# Patient Record
Sex: Female | Born: 1980 | Race: Black or African American | Hispanic: No | Marital: Married | Smoking: Never smoker
Health system: Southern US, Community
[De-identification: ages and names within clinical notes are randomized; demographics above are authoritative.]

## PROBLEM LIST (undated history)

## (undated) DIAGNOSIS — D649 Anemia, unspecified: Secondary | ICD-10-CM

## (undated) DIAGNOSIS — D219 Benign neoplasm of connective and other soft tissue, unspecified: Secondary | ICD-10-CM

## (undated) DIAGNOSIS — N83209 Unspecified ovarian cyst, unspecified side: Secondary | ICD-10-CM

## (undated) DIAGNOSIS — I10 Essential (primary) hypertension: Secondary | ICD-10-CM

## (undated) DIAGNOSIS — R51 Headache: Secondary | ICD-10-CM

## (undated) DIAGNOSIS — R519 Headache, unspecified: Secondary | ICD-10-CM

## (undated) HISTORY — PX: OVARIAN CYST REMOVAL: SHX89

## (undated) HISTORY — PX: TUBAL LIGATION: SHX77

## (undated) HISTORY — DX: Anemia, unspecified: D64.9

---

## 2014-05-11 ENCOUNTER — Emergency Department (HOSPITAL_COMMUNITY)
Admission: EM | Admit: 2014-05-11 | Discharge: 2014-05-11 | Disposition: A | Discharge status: Home / Self Care | Payer: Self-pay | Attending: Emergency Medicine | Admitting: Emergency Medicine

## 2014-05-11 ENCOUNTER — Encounter (HOSPITAL_COMMUNITY): Payer: Self-pay | Admitting: Emergency Medicine

## 2014-05-11 DIAGNOSIS — I10 Essential (primary) hypertension: Secondary | ICD-10-CM | POA: Insufficient documentation

## 2014-05-11 DIAGNOSIS — N760 Acute vaginitis: Secondary | ICD-10-CM | POA: Insufficient documentation

## 2014-05-11 DIAGNOSIS — Z79899 Other long term (current) drug therapy: Secondary | ICD-10-CM | POA: Insufficient documentation

## 2014-05-11 DIAGNOSIS — Z3202 Encounter for pregnancy test, result negative: Secondary | ICD-10-CM | POA: Insufficient documentation

## 2014-05-11 DIAGNOSIS — D649 Anemia, unspecified: Secondary | ICD-10-CM | POA: Insufficient documentation

## 2014-05-11 HISTORY — DX: Essential (primary) hypertension: I10

## 2014-05-11 LAB — CBC WITH DIFFERENTIAL/PLATELET
BASOS ABS: 0 10*3/uL (ref 0.0–0.1)
Basophils Relative: 0 % (ref 0–1)
EOS ABS: 0.1 10*3/uL (ref 0.0–0.7)
Eosinophils Relative: 1 % (ref 0–5)
HCT: 32 % — ABNORMAL LOW (ref 36.0–46.0)
Hemoglobin: 9.7 g/dL — ABNORMAL LOW (ref 12.0–15.0)
LYMPHS ABS: 2.4 10*3/uL (ref 0.7–4.0)
Lymphocytes Relative: 37 % (ref 12–46)
MCH: 16.9 pg — AB (ref 26.0–34.0)
MCHC: 30.3 g/dL (ref 30.0–36.0)
MCV: 55.8 fL — AB (ref 78.0–100.0)
MONO ABS: 0.4 10*3/uL (ref 0.1–1.0)
Monocytes Relative: 6 % (ref 3–12)
Neutro Abs: 3.7 10*3/uL (ref 1.7–7.7)
Neutrophils Relative %: 56 % (ref 43–77)
PLATELETS: 200 10*3/uL (ref 150–400)
RBC: 5.73 MIL/uL — AB (ref 3.87–5.11)
RDW: 19.6 % — AB (ref 11.5–15.5)
WBC: 6.6 10*3/uL (ref 4.0–10.5)

## 2014-05-11 LAB — URINALYSIS, ROUTINE W REFLEX MICROSCOPIC
BILIRUBIN URINE: NEGATIVE
BILIRUBIN URINE: NEGATIVE
GLUCOSE, UA: NEGATIVE mg/dL
Glucose, UA: NEGATIVE mg/dL
Hgb urine dipstick: NEGATIVE
Hgb urine dipstick: NEGATIVE
Ketones, ur: NEGATIVE mg/dL
Ketones, ur: NEGATIVE mg/dL
NITRITE: NEGATIVE
Nitrite: NEGATIVE
PROTEIN: NEGATIVE mg/dL
Protein, ur: NEGATIVE mg/dL
SPECIFIC GRAVITY, URINE: 1.008 (ref 1.005–1.030)
Specific Gravity, Urine: 1.028 (ref 1.005–1.030)
UROBILINOGEN UA: 1 mg/dL (ref 0.0–1.0)
UROBILINOGEN UA: 0.2 mg/dL (ref 0.0–1.0)
pH: 5.5 (ref 5.0–8.0)
pH: 5.5 (ref 5.0–8.0)

## 2014-05-11 LAB — BASIC METABOLIC PANEL
ANION GAP: 13 (ref 5–15)
BUN: 12 mg/dL (ref 6–23)
CALCIUM: 8.6 mg/dL (ref 8.4–10.5)
CO2: 21 mEq/L (ref 19–32)
CREATININE: 0.78 mg/dL (ref 0.50–1.10)
Chloride: 106 mEq/L (ref 96–112)
Glucose, Bld: 86 mg/dL (ref 70–99)
Potassium: 3.7 mEq/L (ref 3.7–5.3)
SODIUM: 140 meq/L (ref 137–147)

## 2014-05-11 LAB — URINE MICROSCOPIC-ADD ON

## 2014-05-11 LAB — WET PREP, GENITAL
Trich, Wet Prep: NONE SEEN
YEAST WET PREP: NONE SEEN

## 2014-05-11 LAB — POC URINE PREG, ED: Preg Test, Ur: NEGATIVE

## 2014-05-11 MED ORDER — ONDANSETRON HCL 4 MG/2ML IJ SOLN
4.0000 mg | Freq: Once | INTRAMUSCULAR | Status: AC
  Administered 2014-05-11: 4 mg via INTRAVENOUS
  Filled 2014-05-11: qty 2

## 2014-05-11 MED ORDER — LIDOCAINE HCL 1 % IJ SOLN
INTRAMUSCULAR | Status: AC
  Administered 2014-05-11: 22:00:00
  Filled 2014-05-11: qty 20

## 2014-05-11 MED ORDER — AZITHROMYCIN 250 MG PO TABS
1000.0000 mg | ORAL_TABLET | Freq: Once | ORAL | Status: AC
  Administered 2014-05-11: 1000 mg via ORAL
  Filled 2014-05-11: qty 4

## 2014-05-11 MED ORDER — MORPHINE SULFATE 4 MG/ML IJ SOLN
4.0000 mg | Freq: Once | INTRAMUSCULAR | Status: AC
  Administered 2014-05-11: 4 mg via INTRAVENOUS
  Filled 2014-05-11: qty 1

## 2014-05-11 MED ORDER — HYDROCODONE-ACETAMINOPHEN 5-325 MG PO TABS: 1.0000 | ORAL_TABLET | ORAL | Status: DC | PRN

## 2014-05-11 MED ORDER — CEFTRIAXONE SODIUM 250 MG IJ SOLR
250.0000 mg | Freq: Once | INTRAMUSCULAR | Status: AC
  Administered 2014-05-11: 250 mg via INTRAMUSCULAR
  Filled 2014-05-11: qty 250

## 2014-05-11 MED ORDER — ONDANSETRON HCL 4 MG PO TABS: 4.0000 mg | ORAL_TABLET | Freq: Four times a day (QID) | ORAL | Status: DC

## 2014-05-11 NOTE — Progress Notes (Signed)
  CARE MANAGEMENT ED NOTE 05/11/2014  Patient:  LIL, LEPAGE   Account Number:  [id number]  Date Initiated:  05/11/2014  Documentation initiated by:  Livia Snellen  Subjective/Objective Assessment:   Patient presents to Ed with left lower abdominal pain and left flank pain     Subjective/Objective Assessment Detail:     Action/Plan:   Action/Plan Detail:   Anticipated DC Date:       Status Recommendation to Physician:   Result of Recommendation:    Other ED [place]  Other  PCP issues    Choice offered to / List presented to:            Status of service:  Completed, signed off  ED Comments:   ED Comments Detail:  EDCM spoke ot patient at bedside.  Patient confirms she does not have a pcp or insurance living in [place]. EDCM provide patient with pamphlet to [facility], informed patient of services there and walk in times.  EDCM also provided patient with list of pcps who accept self pay patients, list of discount pharmacies and websites needymeds.org and GoodRX.com for medication assistance, phone number to inquire about the orange card, phone number to inquire about [employer], phone number to inquire about the [place], financial resources in the community such as local churches, salvation [employer], urban ministries, and dental assistance for uninsured patients. Patient thankfulf or resources.  No further EDCM needs at this time. Referral made to [facility] rep.

## 2014-05-11 NOTE — ED Notes (Signed)
Patient with c/o LLQ abdominal pain and left side flank pain Patient denies dysuria, N/V/D or other issues Patient ambulatory from triage without difficulty or assistance from staff Patient alert and oriented x 4 upon arrival

## 2014-05-11 NOTE — ED Provider Notes (Signed)
CSN: [id number]     Arrival date & time 05/11/14  1750 History   First MD Initiated Contact with Patient 05/11/14 Robin Larson     Chief Complaint  Patient presents with  . Flank Pain  . Abdominal Pain     (Consider location/radiation/quality/duration/timing/severity/associated sxs/prior Treatment) HPI  Patient presents to the ER for evaluation of left sided flank pain that started on Monday. She has also been having urinary frequency. She denies abnormal uterine bleeding, dysuria, vaginal discharge, N/V/D or fevers.She feels as though these are premenstrual cramping but she is not bleeding and continues to have pain. Her symptoms have actually resolved moderately but she asked her grandmother if she has had these pains before and she reportedly have uterine fibroids. Because of this, the patient felt like she needed to be seen emergenetly. She declines offer for IV. She declines offer for pain medications at this time.    Past Medical History  Diagnosis Date  . Hypertension    History reviewed. No pertinent past surgical history. History reviewed. No pertinent family history. History  Substance Use Topics  . Smoking status: Never Smoker   . Smokeless tobacco: Not on file  . Alcohol Use: No   OB History   Grav Para Term Preterm Abortions TAB SAB Ect Mult Living                 Review of Systems   Review of Systems  Gen: no weight loss, fevers, chills, night sweats  Eyes: no occular draining, occular pain,  No visual changes  Nose: no epistaxis or rhinorrhea  Mouth: no dental pain, no sore throat  Neck: no neck pain  Lungs: No hemoptysis. No wheezing or coughing CV:  No palpitations, dependent edema or orthopnea. No chest pain Abd: no diarrhea. No nausea or vomiting, No abdominal pain  GU: no dysuria or gross hematuria  + flank pain MSK:  No muscle weakness, No muscular pain Neuro: no headache, no focal neurologic deficits  Skin: no rash , no wounds Psyche: no complaints of  depression or anxiety    Allergies  Codeine  Home Medications   Prior to Admission medications   Medication Sig Start Date End Date Taking? Authorizing Provider  BIOTIN PO Take 1 tablet by mouth daily.   Yes Historical Provider, MD  ibuprofen (ADVIL,MOTRIN) 200 MG tablet Take 800 mg by mouth every 6 (six) hours as needed for moderate pain.   Yes Historical Provider, MD   BP 139/73  Pulse 77  Temp(Src) 98.7 F (37.1 C) (Oral)  Resp 18  SpO2 100%  LMP 04/11/2014 Physical Exam  Nursing note and vitals reviewed. Constitutional: She appears well-developed and well-nourished. No distress.  HENT:  Head: Normocephalic and atraumatic.  Eyes: Pupils are equal, round, and reactive to light.  Neck: Normal range of motion. Neck supple.  Cardiovascular: Normal rate and regular rhythm.   Pulmonary/Chest: Effort normal.  Abdominal: Soft. Bowel sounds are normal. She exhibits no distension and no fluid wave. There is tenderness. There is CVA tenderness (left side).  Genitourinary:  Left sided flank pain.  Neurological: She is alert.  Skin: Skin is warm and dry.    ED Course  Procedures (including critical care time) Labs Review Labs Reviewed  WET PREP, GENITAL - Abnormal; Notable for the following:    Clue Cells Wet Prep HPF POC FEW (*)    WBC, Wet Prep HPF POC MANY (*)    All other components within normal limits  URINALYSIS, ROUTINE W  REFLEX MICROSCOPIC - Abnormal; Notable for the following:    APPearance CLOUDY (*)    Leukocytes, UA LARGE (*)    All other components within normal limits  URINE MICROSCOPIC-ADD ON - Abnormal; Notable for the following:    Squamous Epithelial / LPF MANY (*)    Bacteria, UA MANY (*)    All other components within normal limits  URINALYSIS, ROUTINE W REFLEX MICROSCOPIC - Abnormal; Notable for the following:    Leukocytes, UA SMALL (*)    All other components within normal limits  CBC WITH DIFFERENTIAL - Abnormal; Notable for the following:     RBC 5.73 (*)    Hemoglobin 9.7 (*)    HCT 32.0 (*)    MCV 55.8 (*)    MCH 16.9 (*)    RDW 19.6 (*)    All other components within normal limits  GC/CHLAMYDIA PROBE AMP  BASIC METABOLIC PANEL  URINE MICROSCOPIC-ADD ON  POC URINE PREG, ED    Imaging Review No results found.   EKG Interpretation None      MDM   Final diagnoses:  Anemia, unspecified anemia type  Vaginosis    Medications  azithromycin (ZITHROMAX) tablet 1,000 mg (not administered)  cefTRIAXone (ROCEPHIN) injection 250 mg (not administered)  ondansetron (ZOFRAN) injection 4 mg (4 mg Intravenous Given 05/11/14 2027)  morphine 4 MG/ML injection 4 mg (4 mg Intravenous Given 05/11/14 2027)    Patient to the ER with flank/lower quadrant abdominal pains. She had a dirty urine but after a repeat (clean catch) Her urine looks much improved. Her CBC shows 9.7 hemoglobin she reports having been told she is anemic and not taking  Her iron as directed. She is unsure of her baseline.  Her BMP is unremarkable. Her wet prep shows MANY WBC. Treated with Azithromycin and Rocephin in the ED. Will dc with pain and nausea medications.  33 y.o.[name]'s evaluation in the Emergency Department is complete. It has been determined that no acute conditions requiring further emergency intervention are present at this time. The patient/guardian have been advised of the diagnosis and plan. We have discussed signs and symptoms that warrant return to the ED, such as changes or worsening in symptoms.  Vital signs are stable at discharge. Filed Vitals:   05/11/14 1756  BP: 139/73  Pulse: 77  Temp: 98.7 F (37.1 C)  Resp: 18    Patient/guardian has voiced understanding and agreed to follow-up with the PCP or specialist.    Linus Mako, PA-C 05/11/14 2152

## 2014-05-11 NOTE — ED Provider Notes (Signed)
Medical screening examination/treatment/procedure(s) were performed by non-physician practitioner and as supervising physician I was immediately available for consultation/collaboration.   EKG Interpretation None       Orlie Dakin, MD 05/11/14 2308

## 2014-05-11 NOTE — Discharge Instructions (Signed)

## 2014-05-11 NOTE — ED Notes (Signed)
Pt c/o LLQ pain radiating to flank since Monday.  Denies NVD.  Denies vaginal discharge.  Denies dysuria.

## 2014-05-12 LAB — GC/CHLAMYDIA PROBE AMP
CT Probe RNA: POSITIVE — AB
GC Probe RNA: NEGATIVE

## 2014-05-13 ENCOUNTER — Telehealth (HOSPITAL_BASED_OUTPATIENT_CLINIC_OR_DEPARTMENT_OTHER): Payer: Self-pay | Admitting: Emergency Medicine

## 2014-05-13 NOTE — Telephone Encounter (Signed)
Positive Chlamydia culture Treated with Zithromax and Rocephin [facility] faxed  Left message for patient to call flow managers #

## 2014-05-15 ENCOUNTER — Telehealth (HOSPITAL_COMMUNITY): Payer: Self-pay

## 2014-06-19 ENCOUNTER — Encounter (HOSPITAL_COMMUNITY): Payer: Self-pay | Admitting: Emergency Medicine

## 2014-06-19 ENCOUNTER — Emergency Department (HOSPITAL_COMMUNITY): Payer: Self-pay

## 2014-06-19 ENCOUNTER — Emergency Department (HOSPITAL_COMMUNITY)
Admission: EM | Admit: 2014-06-19 | Discharge: 2014-06-19 | Disposition: A | Discharge status: Home / Self Care | Payer: Self-pay | Attending: Emergency Medicine | Admitting: Emergency Medicine

## 2014-06-19 DIAGNOSIS — R229 Localized swelling, mass and lump, unspecified: Secondary | ICD-10-CM

## 2014-06-19 DIAGNOSIS — D500 Iron deficiency anemia secondary to blood loss (chronic): Secondary | ICD-10-CM

## 2014-06-19 DIAGNOSIS — I10 Essential (primary) hypertension: Secondary | ICD-10-CM | POA: Insufficient documentation

## 2014-06-19 DIAGNOSIS — Z8619 Personal history of other infectious and parasitic diseases: Secondary | ICD-10-CM | POA: Insufficient documentation

## 2014-06-19 DIAGNOSIS — R1032 Left lower quadrant pain: Secondary | ICD-10-CM | POA: Insufficient documentation

## 2014-06-19 DIAGNOSIS — IMO0002 Reserved for concepts with insufficient information to code with codable children: Secondary | ICD-10-CM

## 2014-06-19 DIAGNOSIS — R1904 Left lower quadrant abdominal swelling, mass and lump: Secondary | ICD-10-CM | POA: Insufficient documentation

## 2014-06-19 DIAGNOSIS — R52 Pain, unspecified: Secondary | ICD-10-CM

## 2014-06-19 DIAGNOSIS — Z79899 Other long term (current) drug therapy: Secondary | ICD-10-CM | POA: Insufficient documentation

## 2014-06-19 DIAGNOSIS — Z3202 Encounter for pregnancy test, result negative: Secondary | ICD-10-CM | POA: Insufficient documentation

## 2014-06-19 LAB — CBC WITH DIFFERENTIAL/PLATELET
Basophils Absolute: 0 10*3/uL (ref 0.0–0.1)
Basophils Relative: 0 % (ref 0–1)
EOS ABS: 0.1 10*3/uL (ref 0.0–0.7)
Eosinophils Relative: 2 % (ref 0–5)
HEMATOCRIT: 29.8 % — AB (ref 36.0–46.0)
HEMOGLOBIN: 8.9 g/dL — AB (ref 12.0–15.0)
LYMPHS PCT: 34 % (ref 12–46)
Lymphs Abs: 1.8 10*3/uL (ref 0.7–4.0)
MCH: 16.7 pg — ABNORMAL LOW (ref 26.0–34.0)
MCHC: 29.9 g/dL — ABNORMAL LOW (ref 30.0–36.0)
MCV: 56 fL — ABNORMAL LOW (ref 78.0–100.0)
MONOS PCT: 5 % (ref 3–12)
Monocytes Absolute: 0.3 10*3/uL (ref 0.1–1.0)
Neutro Abs: 3 10*3/uL (ref 1.7–7.7)
Neutrophils Relative %: 59 % (ref 43–77)
Platelets: 218 10*3/uL (ref 150–400)
RBC: 5.32 MIL/uL — AB (ref 3.87–5.11)
RDW: 20.3 % — ABNORMAL HIGH (ref 11.5–15.5)
WBC: 5.2 10*3/uL (ref 4.0–10.5)

## 2014-06-19 LAB — LIPASE, BLOOD: Lipase: 48 U/L (ref 11–59)

## 2014-06-19 LAB — URINALYSIS, ROUTINE W REFLEX MICROSCOPIC
BILIRUBIN URINE: NEGATIVE
GLUCOSE, UA: NEGATIVE mg/dL
Ketones, ur: NEGATIVE mg/dL
Nitrite: NEGATIVE
Protein, ur: NEGATIVE mg/dL
SPECIFIC GRAVITY, URINE: 1.018 (ref 1.005–1.030)
Urobilinogen, UA: 1 mg/dL (ref 0.0–1.0)
pH: 7 (ref 5.0–8.0)

## 2014-06-19 LAB — COMPREHENSIVE METABOLIC PANEL
ALBUMIN: 3.5 g/dL (ref 3.5–5.2)
ALK PHOS: 50 U/L (ref 39–117)
ALT: 9 U/L (ref 0–35)
AST: 12 U/L (ref 0–37)
Anion gap: 10 (ref 5–15)
BUN: 13 mg/dL (ref 6–23)
CHLORIDE: 106 meq/L (ref 96–112)
CO2: 23 mEq/L (ref 19–32)
Calcium: 8.9 mg/dL (ref 8.4–10.5)
Creatinine, Ser: 0.8 mg/dL (ref 0.50–1.10)
GFR calc Af Amer: >90 mL/min (ref >90)
GFR calc non Af Amer: >90 mL/min (ref >90)
Glucose, Bld: 95 mg/dL (ref 70–99)
Potassium: 4.1 mEq/L (ref 3.7–5.3)
SODIUM: 139 meq/L (ref 137–147)
TOTAL PROTEIN: 7 g/dL (ref 6.0–8.3)

## 2014-06-19 LAB — URINE MICROSCOPIC-ADD ON

## 2014-06-19 LAB — POC URINE PREG, ED: PREG TEST UR: NEGATIVE

## 2014-06-19 MED ORDER — IOHEXOL 300 MG/ML  SOLN
50.0000 mL | Freq: Once | INTRAMUSCULAR | Status: AC | PRN
  Administered 2014-06-19: 50 mL via ORAL

## 2014-06-19 MED ORDER — MORPHINE SULFATE 4 MG/ML IJ SOLN
4.0000 mg | Freq: Once | INTRAMUSCULAR | Status: AC
  Administered 2014-06-19: 4 mg via INTRAVENOUS
  Filled 2014-06-19: qty 1

## 2014-06-19 MED ORDER — SODIUM CHLORIDE 0.9 % IV BOLUS (SEPSIS)
1000.0000 mL | Freq: Once | INTRAVENOUS | Status: AC
  Administered 2014-06-19: 1000 mL via INTRAVENOUS

## 2014-06-19 MED ORDER — IOHEXOL 300 MG/ML  SOLN
100.0000 mL | Freq: Once | INTRAMUSCULAR | Status: AC | PRN
  Administered 2014-06-19: 100 mL via INTRAVENOUS

## 2014-06-19 MED ORDER — KETOROLAC TROMETHAMINE 30 MG/ML IJ SOLN
30.0000 mg | Freq: Once | INTRAMUSCULAR | Status: AC
  Administered 2014-06-19: 30 mg via INTRAVENOUS
  Filled 2014-06-19: qty 1

## 2014-06-19 MED ORDER — ONDANSETRON HCL 4 MG/2ML IJ SOLN
4.0000 mg | Freq: Once | INTRAMUSCULAR | Status: AC
  Administered 2014-06-19: 4 mg via INTRAVENOUS
  Filled 2014-06-19: qty 2

## 2014-06-19 MED ORDER — OXYCODONE-ACETAMINOPHEN 5-325 MG PO TABS
2.0000 | ORAL_TABLET | ORAL | Status: DC | PRN
Start: 2014-06-19 — End: 2014-08-01

## 2014-06-19 NOTE — ED Provider Notes (Addendum)
CSN: [id number]     Arrival date & time 06/19/14  1150 History   First MD Initiated Contact with Patient 06/19/14 1309     Chief Complaint  Patient presents with  . Abdominal Pain     (Consider location/radiation/quality/duration/timing/severity/associated sxs/prior Treatment) HPI 33 year old female who presents today complaining of left lower abdominal pain. She states it has been present for a month. She was seen here for the same pain in October. She says that it has been present ongoing some level since that time. She states it does wax and wane and feels has gotten worse. She has regular menstrual periods to her last menstrual cycle being last week. Says that they are heavy. She denies any urinary tract infection symptoms such as increased frequency of urination or pain with urination. She denies any change in bowel habits. She has been taking by mouth as usual and has not had any change in her weight. Her last visit she was positive for chlamydia and was treated here. Past Medical History  Diagnosis Date  . Hypertension    History reviewed. No pertinent past surgical history. No family history on file. History  Substance Use Topics  . Smoking status: Never Smoker   . Smokeless tobacco: Not on file  . Alcohol Use: No   OB History    No data available     Review of Systems  All other systems reviewed and are negative.     Allergies  Codeine  Home Medications   Prior to Admission medications   Medication Sig Start Date End Date Taking? Authorizing Provider  BIOTIN PO Take 1 tablet by mouth daily.   Yes Historical Provider, MD  HYDROcodone-acetaminophen (NORCO/VICODIN) 5-325 MG per tablet Take 1-2 tablets by mouth every 4 (four) hours as needed for moderate pain or severe pain. 05/11/14  Yes Tiffany Marilu Favre, PA-C  ibuprofen (ADVIL,MOTRIN) 200 MG tablet Take 800 mg by mouth every 6 (six) hours as needed for moderate pain.   Yes Historical Provider, MD  ondansetron  (ZOFRAN) 4 MG tablet Take 1 tablet (4 mg total) by mouth every 6 (six) hours. Patient not taking: Reported on 06/19/2014 05/11/14   Linus Mako, PA-C   BP 102/61 mmHg  Pulse 71  Temp(Src) 98.5 F (36.9 C) (Oral)  Resp 16  Wt 220 lb (99.791 kg)  SpO2 100%  LMP 06/12/2014 Physical Exam  Constitutional: She is oriented to person, place, and time. She appears well-developed and well-nourished.  HENT:  Head: Normocephalic and atraumatic.  Right Ear: External ear normal.  Left Ear: External ear normal.  Nose: Nose normal.  Mouth/Throat: Oropharynx is clear and moist.  Eyes: Conjunctivae and EOM are normal. Pupils are equal, round, and reactive to light.  Neck: Normal range of motion.  Cardiovascular: Normal rate and regular rhythm.   Pulmonary/Chest: Effort normal and breath sounds normal.  Abdominal: There is tenderness.    Genitourinary: Vagina normal. Uterus is enlarged and tender. Cervix exhibits no motion tenderness, no discharge and no friability. Left adnexum displays tenderness and fullness. Left adnexum displays no mass.  Musculoskeletal: Normal range of motion.  Neurological: She is alert and oriented to person, place, and time. She has normal reflexes.  Skin: Skin is warm and dry.  Psychiatric: She has a normal mood and affect. Her behavior is normal. Judgment and thought content normal.  Nursing note and vitals reviewed.   ED Course  Procedures (including critical care time) Labs Review Labs Reviewed  CBC WITH DIFFERENTIAL -  Abnormal; Notable for the following:    RBC 5.32 (*)    Hemoglobin 8.9 (*)    HCT 29.8 (*)    MCV 56.0 (*)    MCH 16.7 (*)    MCHC 29.9 (*)    RDW 20.3 (*)    All other components within normal limits  COMPREHENSIVE METABOLIC PANEL - Abnormal; Notable for the following:    Total Bilirubin <0.2 (*)    All other components within normal limits  URINALYSIS, ROUTINE W REFLEX MICROSCOPIC - Abnormal; Notable for the following:    APPearance  CLOUDY (*)    Hgb urine dipstick LARGE (*)    Leukocytes, UA SMALL (*)    All other components within normal limits  URINE MICROSCOPIC-ADD ON - Abnormal; Notable for the following:    Squamous Epithelial / LPF FEW (*)    Bacteria, UA FEW (*)    All other components within normal limits  LIPASE, BLOOD  POC URINE PREG, ED    Imaging Review Ct Abdomen Pelvis W Contrast  06/19/2014   CLINICAL DATA:  LEFT lower quadrant pain for 2 months. Constipation.  EXAM: CT ABDOMEN AND PELVIS WITH CONTRAST  TECHNIQUE: Multidetector CT imaging of the abdomen and pelvis was performed using the standard protocol following bolus administration of intravenous contrast.  CONTRAST:  124mL OMNIPAQUE IOHEXOL 300 MG/ML SOLN, 62mL OMNIPAQUE IOHEXOL 300 MG/ML SOLN  COMPARISON:  None.  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 No aggressive osseous lesions.  Lung Bases: Normal.  Liver:  Normal.  Spleen:  Normal.  Gallbladder:  Normal.  Common bile duct:  Normal.  Pancreas:  Normal.  Adrenal glands:  Normal bilaterally.  Kidneys: Normal enhancement. Both ureters appear within normal limits. No hydronephrosis. No calculi.  Stomach:  Partially collapsed.  No inflammatory changes.  Small bowel: Duodenum appears normal. No small bowel obstruction. No mesenteric adenopathy identified.  Colon: Large stool burden. Normal appendix. Rectosigmoid appears normal.  Pelvic Genitourinary: urinary bladder collapsed. Cystic LEFT adnexal mass is present. This measures 9.6 cm transverse by 5.7 cm AP and 11 cm craniocaudal. Multiple septations are present. Uterus grossly appears within normal limits. RIGHT adnexa abnormal.  Vasculature: Normal.  Body Wall: Tiny fat containing periumbilical hernia. Lower abdominal wall scar, probably from C-section.  IMPRESSION: Approximately 10 cm x 6 cm x 11 cm Cystic LEFT adnexal mass. Pelvic ultrasound recommended for further  assessment. This recommendation follows ACR consensus guidelines: White Paper of the ACR Incidental Findings Committee II on Adnexal Findings. J Am Coll Radiol [phone].   Electronically Signed   By: Dereck Ligas M.D.   On: 06/19/2014 15:36     EKG Interpretation None      MDM   Final diagnoses:  Pain  Mass    CT significant for llq mass, [place] ordered. Patient treated with rocephin and zithromax last evaluation.  No cultures sent today.   Discussed with Dr. Nehemiah Settle and they will call tomorrow for follow-up. He requests a CA-125 and this has been ordered. I have relayed all this to the patient she voices understanding of plan and need for close follow-up.  Shaune Pollack, MD 06/19/14 [place], MD 06/25/14 [phone]

## 2014-06-19 NOTE — ED Notes (Signed)
Notified Pt we needed urine sample. Pt stated she couldn't go at this time. Requested some water to help her go. Pt is also requesting Pain meds. Explained to Pt she had to wait till Dr has seen her. RN aware

## 2014-06-19 NOTE — Progress Notes (Signed)
  CARE MANAGEMENT ED NOTE 06/19/2014  Patient:  Robin Larson, Robin Larson   Account Number:  [id number]  Date Initiated:  06/19/2014  Documentation initiated by:  Livia Snellen  Subjective/Objective Assessment:   Patient presents to ED with left lower abdominal pain.     Subjective/Objective Assessment Detail:   Patient with pmhx of HTN     Action/Plan:   Action/Plan Detail:   Anticipated DC Date:       Status Recommendation to Physician:   Result of Recommendation:    Other ED [place]  Other  PCP issues    Choice offered to / List presented to:            Status of service:  Completed, signed off  ED Comments:   ED Comments Detail:  EDCM spoke to patient at bedside.  EDCM has spoken to this patient before in October.  Patient confirms she still has the information [facility] provided her at that time.  Patient reports she will be receiving health insurnace through her husband's job in January.  EDCM strongly encouraged patient to use resources provided to find a pcp.  Discussed purpose of pcp.  No further EDCM needs at this time.

## 2014-06-19 NOTE — ED Notes (Signed)
Pt alert, arrives from home, c/o left lower abd pain, seen recently with similar c/o, associates pain after having monthly cycle, resp even unlabored, skin pwd

## 2014-06-19 NOTE — Discharge Instructions (Signed)
You have a mass in the left pelvis.  This needs close follow up.  Gynecology should contact you tomorrow to arrange follow up.  If you are not contacted by Wednesday, you can call me for assistance. [phone])

## 2014-06-21 ENCOUNTER — Encounter: Payer: Self-pay | Admitting: Family Medicine

## 2014-06-21 ENCOUNTER — Ambulatory Visit (INDEPENDENT_AMBULATORY_CARE_PROVIDER_SITE_OTHER): Payer: Self-pay | Admitting: Family Medicine

## 2014-06-21 VITALS — BP 119/91 | HR 83 | Temp 98.5°F | Wt 247.5 lb

## 2014-06-21 DIAGNOSIS — N83299 Other ovarian cyst, unspecified side: Secondary | ICD-10-CM

## 2014-06-21 DIAGNOSIS — Z01419 Encounter for gynecological examination (general) (routine) without abnormal findings: Secondary | ICD-10-CM

## 2014-06-21 DIAGNOSIS — Z124 Encounter for screening for malignant neoplasm of cervix: Secondary | ICD-10-CM

## 2014-06-21 DIAGNOSIS — N8320 Unspecified ovarian cysts: Secondary | ICD-10-CM

## 2014-06-21 NOTE — Progress Notes (Signed)
Subjective:    Patient ID: Robin Larson, female    DOB: Jan 07, 1981, 33 y.o.   MRN: [id number]  HPI 33 yo G2P1103 referred by [facility] for a symptomatic left 10 cm ovarian cyst.  The patient has had left abdominal pain for 2-3 months that is intermittent in nature, but worsening.  Pain worse with movement.  She was evaluated previously for this in the ED and was treated for PID without improvement.  OTC analgesics were not helpful.  Since being in the ED, the pain has decreased and she has not used any of the Percocet prescribed to her.  GYN: Started menses at age 13, currently has 28 day cycle with moderate to heavy bleeding for 7-9 days.  OB History    Gravida Para Term Preterm AB TAB SAB Ectopic Multiple Living   2 2 1 1     1 3      Past Medical History  Diagnosis Date  . Hypertension   . Anemia    Current outpatient prescriptions: oxyCODONE-acetaminophen (PERCOCET/ROXICET) 5-325 MG per tablet, Take 2 tablets by mouth every 4 (four) hours as needed for severe pain., Disp: 15 tablet, Rfl: 0;  BIOTIN PO, Take 1 tablet by mouth daily., Disp: , Rfl:  HYDROcodone-acetaminophen (NORCO/VICODIN) 5-325 MG per tablet, Take 1-2 tablets by mouth every 4 (four) hours as needed for moderate pain or severe pain. (Patient not taking: Reported on 06/21/2014), Disp: 12 tablet, Rfl: 0;  ibuprofen (ADVIL,MOTRIN) 200 MG tablet, Take 800 mg by mouth every 6 (six) hours as needed for moderate pain., Disp: , Rfl:  ondansetron (ZOFRAN) 4 MG tablet, Take 1 tablet (4 mg total) by mouth every 6 (six) hours. (Patient not taking: Reported on 06/19/2014), Disp: 12 tablet, Rfl: 0   Allergies  Allergen Reactions  . Codeine     itching   History reviewed. No pertinent family history.  History   Social History  . Marital Status: Single    Spouse Name: N/A    Number of Children: N/A  . Years of Education: N/A   Occupational History  . Not on file.   Social History Main Topics  . Smoking status: Never  Smoker   . Smokeless tobacco: Never Used  . Alcohol Use: No  . Drug Use: No  . Sexual Activity: Yes    Birth Control/ Protection: None   Other Topics Concern  . Not on file   Social History Narrative     Review of Systems  Constitutional: Negative for fever, chills and fatigue.  Respiratory: Negative for cough, wheezing and stridor.   Cardiovascular: Negative for chest pain, palpitations and leg swelling.  Gastrointestinal: Negative for nausea, vomiting, abdominal pain, diarrhea and constipation.  Genitourinary: Negative for dysuria, urgency, hematuria, decreased urine volume, vaginal bleeding and vaginal pain.  Neurological: Negative for dizziness, tremors, seizures, weakness, numbness and headaches.       Objective:   Physical Exam  Constitutional: She is oriented to person, place, and time. She appears well-developed and well-nourished.  HENT:  Head: Normocephalic and atraumatic.  Eyes: Pupils are equal, round, and reactive to light.  Cardiovascular: Normal rate, regular rhythm and normal heart sounds.   Pulmonary/Chest: Effort normal and breath sounds normal. No respiratory distress. She has no wheezes. She has no rales. She exhibits no tenderness.  Abdominal: Soft. Bowel sounds are normal. She exhibits no distension. There is tenderness (left lower quadrant). Hernia confirmed negative in the right inguinal area and confirmed negative in the left inguinal  area.  Genitourinary: There is no rash, tenderness, lesion or injury on the right labia. There is no rash, tenderness, lesion or injury on the left labia. Uterus is not deviated, not enlarged, not fixed and not tender. Cervix exhibits no motion tenderness, no discharge and no friability. Right adnexum displays no mass, no tenderness and no fullness. Left adnexum displays no mass, no tenderness and no fullness. No erythema, tenderness or bleeding in the vagina. No foreign body around the vagina. No signs of injury around the  vagina. No vaginal discharge found.  Lymphadenopathy:       Right: No inguinal adenopathy present.       Left: No inguinal adenopathy present.  Neurological: She is alert and oriented to person, place, and time.  Skin: Skin is warm and dry.  Psychiatric: She has a normal mood and affect. Her behavior is normal. Judgment and thought content normal.  Vitals reviewed.     Assessment & Plan:   Problem List Items Addressed This Visit    Complex ovarian cyst - Primary    Other Visit Diagnoses    Pap smear, as part of routine gynecological examination          CA125 still pending. Discussed that if benign, may need to have cystectomy.  Schedule in 2 weeks with Gyn surgeon. PAP done.

## 2014-06-26 LAB — CYTOLOGY - PAP

## 2014-06-27 ENCOUNTER — Telehealth: Payer: Self-pay

## 2014-06-27 LAB — CA 125: CA 125: 12 U/mL (ref <35)

## 2014-06-27 NOTE — Telephone Encounter (Signed)
-----   Message from Truett Mainland, DO sent at 06/27/2014  3:05 PM EST ----- Has abnormal PAP smear - ASCUS, cannot rule out high grade. Has appt with Dr Elly Modena for ovarian cyst.  Please let pt know and add this to appt.

## 2014-06-27 NOTE — Telephone Encounter (Signed)
Need for colpo added to appt notes for 07/26/14. Called patient and informed her of results and need for colpo at next appointment. Patient verbalized understanding and reports having abnormal paps in the past. 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 Patient verbalized understanding. No further questions or concerns.

## 2014-07-26 ENCOUNTER — Ambulatory Visit (INDEPENDENT_AMBULATORY_CARE_PROVIDER_SITE_OTHER): Payer: Self-pay | Admitting: Obstetrics & Gynecology

## 2014-07-26 ENCOUNTER — Encounter: Payer: Self-pay | Admitting: Obstetrics & Gynecology

## 2014-07-26 VITALS — BP 122/82 | HR 86 | Temp 98.7°F | Ht 64.0 in | Wt 253.9 lb

## 2014-07-26 DIAGNOSIS — N83202 Unspecified ovarian cyst, left side: Secondary | ICD-10-CM

## 2014-07-26 DIAGNOSIS — N8320 Unspecified ovarian cysts: Secondary | ICD-10-CM

## 2014-07-26 NOTE — Patient Instructions (Signed)
Ovarian Cyst An ovarian cyst is a fluid-filled sac that forms on an ovary. The ovaries are small organs that produce eggs in women. Various types of cysts can form on the ovaries. Most are not cancerous. Many do not cause problems, and they often go away on their own. Some may cause symptoms and require treatment. Common types of ovarian cysts include:  Functional cysts--These cysts may occur every month during the menstrual cycle. This is normal. The cysts usually go away with the next menstrual cycle if the woman does not get pregnant. Usually, there are no symptoms with a functional cyst.  Endometrioma cysts--These cysts form from the tissue that lines the uterus. They are also called "chocolate cysts" because they become filled with blood that turns brown. This type of cyst can cause pain in the lower abdomen during intercourse and with your menstrual period.  Cystadenoma cysts--This type develops from the cells on the outside of the ovary. These cysts can get very big and cause lower abdomen pain and pain with intercourse. This type of cyst can twist on itself, cut off its blood supply, and cause severe pain. It can also easily rupture and cause a lot of pain.  Dermoid cysts--This type of cyst is sometimes found in both ovaries. These cysts may contain different kinds of body tissue, such as skin, teeth, hair, or cartilage. They usually do not cause symptoms unless they get very big.  Theca lutein cysts--These cysts occur when too much of a certain hormone (human chorionic gonadotropin) is produced and overstimulates the ovaries to produce an egg. This is most common after procedures used to assist with the conception of a baby (in vitro fertilization). CAUSES   Fertility drugs can cause a condition in which multiple large cysts are formed on the ovaries. This is called ovarian hyperstimulation syndrome.  A condition called polycystic ovary syndrome can cause hormonal imbalances that can lead to  nonfunctional ovarian cysts. SIGNS AND SYMPTOMS  Many ovarian cysts do not cause symptoms. If symptoms are present, they may include:  Pelvic pain or pressure.  Pain in the lower abdomen.  Pain during sexual intercourse.  Increasing girth (swelling) of the abdomen.  Abnormal menstrual periods.  Increasing pain with menstrual periods.  Stopping having menstrual periods without being pregnant. DIAGNOSIS  These cysts are commonly found during a routine or annual pelvic exam. Tests may be ordered to find out more about the cyst. These tests may include:  Ultrasound.  X-ray of the pelvis.  CT scan.  MRI.  Blood tests. TREATMENT  Many ovarian cysts go away on their own without treatment. Your health care provider may want to check your cyst regularly for 2-3 months to see if it changes. For women in menopause, it is particularly important to monitor a cyst closely because of the higher rate of ovarian cancer in menopausal women. When treatment is needed, it may include any of the following:  A procedure to drain the cyst (aspiration). This may be done using a long needle and ultrasound. It can also be done through a laparoscopic procedure. This involves using a thin, lighted tube with a tiny camera on the end (laparoscope) inserted through a small incision.  Surgery to remove the whole cyst. This may be done using laparoscopic surgery or an open surgery involving a larger incision in the lower abdomen.  Hormone treatment or birth control pills. These methods are sometimes used to help dissolve a cyst. HOME CARE INSTRUCTIONS   Only take over-the-counter   or prescription medicines as directed by your health care provider.  Follow up with your health care provider as directed.  Get regular pelvic exams and Pap tests. SEEK MEDICAL CARE IF:   Your periods are late, irregular, or painful, or they stop.  Your pelvic pain or abdominal pain does not go away.  Your abdomen becomes  larger or swollen.  You have pressure on your bladder or trouble emptying your bladder completely.  You have pain during sexual intercourse.  You have feelings of fullness, pressure, or discomfort in your stomach.  You lose weight for no apparent reason.  You feel generally ill.  You become constipated.  You lose your appetite.  You develop acne.  You have an increase in body and facial hair.  You are gaining weight, without changing your exercise and eating habits.  You think you are pregnant. SEEK IMMEDIATE MEDICAL CARE IF:   You have increasing abdominal pain.  You feel sick to your stomach (nauseous), and you throw up (vomit).  You develop a fever that comes on suddenly.  You have abdominal pain during a bowel movement.  Your menstrual periods become heavier than usual. MAKE SURE YOU:  Understand these instructions.  Will watch your condition.  Will get help right away if you are not doing well or get worse. Document Released: 07/14/2005 Document Revised: 07/19/2013 Document Reviewed: 03/21/2013 ExitCare Patient Information 2015 [employer]. This information is not intended to replace advice given to you by your health care provider. Make sure you discuss any questions you have with your health care provider.  

## 2014-07-26 NOTE — Progress Notes (Signed)
Abnormal pap will refer to [facility]. Message sent to [facility], LPN.

## 2014-07-26 NOTE — Progress Notes (Signed)
Subjective:     Patient ID: Robin Larson, female   DOB: 22-Dec-1980, 33 y.o.   MRN: [id number]  HPI Pt was seen in Nov in the ED and in the ofc and was dx'd with a left ov cyst.  She reports that she is no longer having pain but, is worried about the reoccurrence of the pain.   Past Medical History  Diagnosis Date  . Hypertension   . Anemia    Past Surgical History  Procedure Laterality Date  . Cesarean section     Current Outpatient Prescriptions on File Prior to Visit  Medication Sig Dispense Refill  . oxyCODONE-acetaminophen (PERCOCET/ROXICET) 5-325 MG per tablet Take 2 tablets by mouth every 4 (four) hours as needed for severe pain. 15 tablet 0  . BIOTIN PO Take 1 tablet by mouth daily.    Marland Kitchen HYDROcodone-acetaminophen (NORCO/VICODIN) 5-325 MG per tablet Take 1-2 tablets by mouth every 4 (four) hours as needed for moderate pain or severe pain. (Patient not taking: Reported on 06/21/2014) 12 tablet 0  . ibuprofen (ADVIL,MOTRIN) 200 MG tablet Take 800 mg by mouth every 6 (six) hours as needed for moderate pain.    Marland Kitchen ondansetron (ZOFRAN) 4 MG tablet Take 1 tablet (4 mg total) by mouth every 6 (six) hours. (Patient not taking: Reported on 06/19/2014) 12 tablet 0   No current facility-administered medications on file prior to visit.   Allergies  Allergen Reactions  . Codeine     itching   History   Social History  . Marital Status: Single    Spouse Name: N/A    Number of Children: N/A  . Years of Education: N/A   Occupational History  . Not on file.   Social History Main Topics  . Smoking status: Never Smoker   . Smokeless tobacco: Never Used  . Alcohol Use: No  . Drug Use: No  . Sexual Activity: Yes    Birth Control/ Protection: None   Other Topics Concern  . Not on file   Social History Narrative     Review of Systems     Objective:   Physical Exam BP 122/82 mmHg  Pulse 86  Temp(Src) 98.7 F (37.1 C) (Oral)  Ht 5\' 4"  (1.626 m)  Wt 253 lb 14.4 oz (115.168  kg)  BMI 43.56 kg/m2  LMP 07/10/2014 (Exact Date) Pt in NAD Abd: well healed transverse incision.  NT, ND GU: EGBUS: no lesions Vagina: no blood in vault Cervix: no lesion; no mucopurulent d/c Uterus: small, mobile Adnexa: large mass on left side.  Appears to be above the uterus.    06/19/2014 EXAM: TRANSABDOMINAL AND TRANSVAGINAL ULTRASOUND OF PELVIS  TECHNIQUE: Both transabdominal and transvaginal ultrasound examinations of the pelvis were performed. Transabdominal technique was performed for global imaging of the pelvis including uterus, ovaries, adnexal regions, and pelvic cul-de-sac. It was necessary to proceed with endovaginal exam following the transabdominal exam to visualize the endometrium.  COMPARISON: CT abdomen and pelvis 06/19/2014  FINDINGS: Uterus  Measurements: 8.8 x 5.6 x 5.7 cm. Heterogeneous myometrial echogenicity. Hypoechoic nodule anterior mid uterus question leiomyoma 1.8 x 1.7 x 1.1 cm.  Endometrium  Thickness: 12 mm thick. Diffuse mild heterogeneity without endometrial fluid or focal abnormality.  Right ovary  Measurements: 3.2 x 2.4 x 2.9 cm. Normal morphology without mass. Internal blood flow present on color Doppler imaging.  Left ovary  Normal appearing LEFT ovarian tissue not identified. Large complex cystic lesion in LEFT adnexa, seen only transabdominally due to high  position, measuring 11.0 x 6.0 x 9.3 cm in size, containing thick irregular septations and areas of mild peripheral/mural nodularity. Blood flow present within some of the thick septations.  Other findings  Trace free pelvic fluid. No additional adnexal masses.  IMPRESSION: Small probable uterine leiomyoma.  Large complex cystic mass within LEFT adnexa likely of LEFT ovarian origin and measuring 11.0 x 6.0 x 9.3 cm in size, containing peripheral/mural nodularity and thick irregular septations, some which contain internal blood flow.  Cystic  ovarian neoplasm not excluded; recommend surgical assessment.  This recommendation follows the consensus statement: Management of Asymptomatic Ovarian and Other Adnexal Cysts Imaged at [place]: Society of Radiologists in [place]. Radiology 2010; [phone].  06/19/2014 EXAM: CT ABDOMEN AND PELVIS WITH CONTRAST  TECHNIQUE: Multidetector CT imaging of the abdomen and pelvis was performed using the standard protocol following bolus administration of intravenous contrast.  CONTRAST: 16mL OMNIPAQUE IOHEXOL 300 MG/ML SOLN, 19mL OMNIPAQUE IOHEXOL 300 MG/ML SOLN  COMPARISON: None.  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 No aggressive osseous lesions.  Lung Bases: Normal.  Liver: Normal.  Spleen: Normal.  Gallbladder: Normal.  Common bile duct: Normal.  Pancreas: Normal.  Adrenal glands: Normal bilaterally.  Kidneys: Normal enhancement. Both ureters appear within normal limits. No hydronephrosis. No calculi.  Stomach: Partially collapsed. No inflammatory changes.  Small bowel: Duodenum appears normal. No small bowel obstruction. No mesenteric adenopathy identified.  Colon: Large stool burden. Normal appendix. Rectosigmoid appears normal.  Pelvic Genitourinary: urinary bladder collapsed. Cystic LEFT adnexal mass is present. This measures 9.6 cm transverse by 5.7 cm AP and 11 cm craniocaudal. Multiple septations are present. Uterus grossly appears within normal limits. RIGHT adnexa abnormal.  Vasculature: Normal.  Body Wall: Tiny fat containing periumbilical hernia. Lower abdominal wall scar, probably from C-section.  IMPRESSION: Approximately 10 cm x 6 cm x 11 cm Cystic LEFT adnexal mass. Pelvic ultrasound recommended for further assessment. This recommendation follows ACR consensus guidelines: White Paper of the ACR  Incidental Findings Committee II on Adnexal Findings. J Am Coll Radiol [id number].     Assessment:     11 cm left ov cyst. Normal CA 125. Rec surgical resection.  Counseled pt about the risks of surgery. There is also a small probability of malignancy.     Plan:     Patient desires surgical management with left oophorectomy vs pvarian cystectomy.  The risks of surgery were discussed in detail with the patient including but not limited to: bleeding which may require transfusion or reoperation; infection which may require prolonged hospitalization or re-hospitalization and antibiotic therapy; injury to bowel, bladder, ureters and major vessels or other surrounding organs; need for additional procedures including laparotomy; thromboembolic phenomenon, incisional problems and other postoperative or anesthesia complications.  Patient was told that the likelihood that her condition and symptoms will be treated effectively with this surgical management was very high; the postoperative expectations were also discussed in detail. The patient also understands the alternative treatment options which were discussed in full. All questions were answered.  She was told that she will be contacted by our surgical scheduler regarding the time and date of her surgery; routine preoperative instructions of having nothing to eat or drink after midnight on the day prior to surgery and also coming to the hospital 1 1/2 hours prior to her time of surgery were also emphasized.  She was told she may be called for a preoperative appointment about  a week prior to surgery and will be given further preoperative instructions at that visit. Printed patient education handouts about the procedure were given to the patient to review at home.

## 2014-07-27 ENCOUNTER — Encounter (HOSPITAL_COMMUNITY): Payer: Self-pay

## 2014-07-27 ENCOUNTER — Encounter (HOSPITAL_COMMUNITY)
Admission: RE | Admit: 2014-07-27 | Discharge: 2014-07-27 | Disposition: A | Discharge status: Home / Self Care | Payer: Self-pay | Source: Ambulatory Visit | Attending: Obstetrics & Gynecology | Admitting: Obstetrics & Gynecology

## 2014-07-27 DIAGNOSIS — Z01812 Encounter for preprocedural laboratory examination: Secondary | ICD-10-CM | POA: Insufficient documentation

## 2014-07-27 HISTORY — DX: Headache: R51

## 2014-07-27 HISTORY — DX: Headache, unspecified: R51.9

## 2014-07-27 LAB — CBC
HEMATOCRIT: 30.3 % — AB (ref 36.0–46.0)
HEMOGLOBIN: 8.9 g/dL — AB (ref 12.0–15.0)
MCH: 16.3 pg — ABNORMAL LOW (ref 26.0–34.0)
MCHC: 29.4 g/dL — ABNORMAL LOW (ref 30.0–36.0)
MCV: 55.4 fL — ABNORMAL LOW (ref 78.0–100.0)
Platelets: 234 10*3/uL (ref 150–400)
RBC: 5.47 MIL/uL — AB (ref 3.87–5.11)
RDW: 20.3 % — ABNORMAL HIGH (ref 11.5–15.5)
WBC: 5 10*3/uL (ref 4.0–10.5)

## 2014-07-27 NOTE — Patient Instructions (Addendum)
   Your procedure is scheduled on:  Tuesday, Jan 5  Enter through the [employer] [facility] at: 2 PM Pick up the phone at the desk and dial [phone] and inform us of your arrival.  Please call this number if you have any problems the morning of surgery: [phone]  Remember: Do not eat food after midnight: Monday Do not drink clear liquids after: 10 AM Tuesday, day of surgery Take these medicines the morning of surgery with a SIP OF WATER:  None  Do not wear jewelry, make-up, or FINGER nail polish No metal in your hair or on your body. Do not wear lotions, powders, perfumes.  You may wear deodorant.  Do not bring valuables to the hospital. Contacts, dentures or bridgework may not be worn into surgery.  Leave suitcase in the car. After Surgery it may be brought to your room. For patients being admitted to the hospital, checkout time is 11:00am the day of discharge.  Home with husband "Okelly" cell [phone]  Patients discharged on the day of surgery will not be allowed to drive home.

## 2014-07-31 MED ORDER — DEXTROSE 5 % IV SOLN
2.0000 g | INTRAVENOUS | Status: AC
  Administered 2014-08-01: 2 g via INTRAVENOUS
  Filled 2014-07-31: qty 2

## 2014-07-31 NOTE — OR Nursing (Signed)
Called and spoke with pt. Instructed her that her surgery had been moved up and she should be here at 1215.

## 2014-08-01 ENCOUNTER — Ambulatory Visit (HOSPITAL_COMMUNITY): Payer: 59 | Admitting: Certified Registered Nurse Anesthetist

## 2014-08-01 ENCOUNTER — Ambulatory Visit (HOSPITAL_COMMUNITY)
Admission: RE | Admit: 2014-08-01 | Discharge: 2014-08-01 | Disposition: A | Discharge status: Home / Self Care | Payer: 59 | Source: Ambulatory Visit | Attending: Obstetrics & Gynecology | Admitting: Obstetrics & Gynecology

## 2014-08-01 ENCOUNTER — Encounter (HOSPITAL_COMMUNITY)
Admission: RE | Disposition: A | Discharge status: Home / Self Care | Payer: Self-pay | Source: Ambulatory Visit | Attending: Obstetrics & Gynecology

## 2014-08-01 ENCOUNTER — Encounter (HOSPITAL_COMMUNITY): Payer: Self-pay

## 2014-08-01 DIAGNOSIS — Z6841 Body Mass Index (BMI) 40.0 and over, adult: Secondary | ICD-10-CM | POA: Diagnosis not present

## 2014-08-01 DIAGNOSIS — Z886 Allergy status to analgesic agent status: Secondary | ICD-10-CM | POA: Insufficient documentation

## 2014-08-01 DIAGNOSIS — Z7982 Long term (current) use of aspirin: Secondary | ICD-10-CM | POA: Diagnosis not present

## 2014-08-01 DIAGNOSIS — I10 Essential (primary) hypertension: Secondary | ICD-10-CM | POA: Insufficient documentation

## 2014-08-01 DIAGNOSIS — N8320 Unspecified ovarian cysts: Secondary | ICD-10-CM | POA: Diagnosis present

## 2014-08-01 DIAGNOSIS — N83202 Unspecified ovarian cyst, left side: Secondary | ICD-10-CM

## 2014-08-01 HISTORY — PX: LAPAROTOMY: SHX154

## 2014-08-01 HISTORY — PX: OOPHORECTOMY: SHX6387

## 2014-08-01 LAB — CBC
HCT: 32 % — ABNORMAL LOW (ref 36.0–46.0)
HEMOGLOBIN: 9.5 g/dL — AB (ref 12.0–15.0)
MCH: 16.3 pg — ABNORMAL LOW (ref 26.0–34.0)
MCHC: 29.7 g/dL — ABNORMAL LOW (ref 30.0–36.0)
MCV: 55 fL — AB (ref 78.0–100.0)
Platelets: 223 10*3/uL (ref 150–400)
RBC: 5.82 MIL/uL — AB (ref 3.87–5.11)
RDW: 20.4 % — ABNORMAL HIGH (ref 11.5–15.5)
WBC: 5.4 10*3/uL (ref 4.0–10.5)

## 2014-08-01 LAB — TYPE AND SCREEN
ABO/RH(D): A POS
Antibody Screen: NEGATIVE

## 2014-08-01 LAB — PREGNANCY, URINE: Preg Test, Ur: NEGATIVE

## 2014-08-01 LAB — ABO/RH: ABO/RH(D): A POS

## 2014-08-01 SURGERY — LAPAROTOMY, EXPLORATORY
Anesthesia: General | Site: Abdomen | Laterality: Left

## 2014-08-01 MED ORDER — OXYCODONE-ACETAMINOPHEN 10-325 MG PO TABS: 1.0000 | ORAL_TABLET | ORAL | Status: DC | PRN

## 2014-08-01 MED ORDER — PROPOFOL 10 MG/ML IV BOLUS
INTRAVENOUS | Status: DC | PRN
  Administered 2014-08-01: 200 mg via INTRAVENOUS
  Administered 2014-08-01: 50 mg via INTRAVENOUS

## 2014-08-01 MED ORDER — NEOSTIGMINE METHYLSULFATE 10 MG/10ML IV SOLN
INTRAVENOUS | Status: DC | PRN
  Administered 2014-08-01: 4 mg via INTRAVENOUS

## 2014-08-01 MED ORDER — ROCURONIUM BROMIDE 100 MG/10ML IV SOLN
INTRAVENOUS | Status: DC | PRN
  Administered 2014-08-01: 50 mg via INTRAVENOUS

## 2014-08-01 MED ORDER — FENTANYL CITRATE 0.05 MG/ML IJ SOLN
INTRAMUSCULAR | Status: AC
  Filled 2014-08-01: qty 5

## 2014-08-01 MED ORDER — FENTANYL CITRATE 0.05 MG/ML IJ SOLN
INTRAMUSCULAR | Status: AC
  Filled 2014-08-01: qty 2

## 2014-08-01 MED ORDER — FENTANYL CITRATE 0.05 MG/ML IJ SOLN
25.0000 ug | INTRAMUSCULAR | Status: DC | PRN
  Administered 2014-08-01 (×2): 50 ug via INTRAVENOUS

## 2014-08-01 MED ORDER — BUPIVACAINE HCL (PF) 0.5 % IJ SOLN
INTRAMUSCULAR | Status: AC
  Filled 2014-08-01: qty 30

## 2014-08-01 MED ORDER — SCOPOLAMINE 1 MG/3DAYS TD PT72
MEDICATED_PATCH | TRANSDERMAL | Status: AC
  Filled 2014-08-01: qty 1

## 2014-08-01 MED ORDER — SCOPOLAMINE 1 MG/3DAYS TD PT72
1.0000 | MEDICATED_PATCH | Freq: Once | TRANSDERMAL | Status: AC
  Administered 2014-08-01: 1.5 mg via TRANSDERMAL
  Administered 2014-08-01: 1 via TRANSDERMAL

## 2014-08-01 MED ORDER — ONDANSETRON HCL 4 MG/2ML IJ SOLN
INTRAMUSCULAR | Status: DC | PRN
  Administered 2014-08-01: 4 mg via INTRAVENOUS

## 2014-08-01 MED ORDER — MIDAZOLAM HCL 2 MG/2ML IJ SOLN
INTRAMUSCULAR | Status: DC | PRN
  Administered 2014-08-01: 2 mg via INTRAVENOUS

## 2014-08-01 MED ORDER — LIDOCAINE HCL (CARDIAC) 20 MG/ML IV SOLN
INTRAVENOUS | Status: AC
  Filled 2014-08-01: qty 5

## 2014-08-01 MED ORDER — PROPOFOL 10 MG/ML IV BOLUS
INTRAVENOUS | Status: AC
  Filled 2014-08-01: qty 20

## 2014-08-01 MED ORDER — LIDOCAINE HCL (CARDIAC) 20 MG/ML IV SOLN
INTRAVENOUS | Status: DC | PRN
  Administered 2014-08-01: 100 mg via INTRAVENOUS

## 2014-08-01 MED ORDER — KETOROLAC TROMETHAMINE 30 MG/ML IJ SOLN
INTRAMUSCULAR | Status: AC
  Filled 2014-08-01: qty 1

## 2014-08-01 MED ORDER — DEXAMETHASONE SODIUM PHOSPHATE 10 MG/ML IJ SOLN
INTRAMUSCULAR | Status: AC
  Filled 2014-08-01: qty 1

## 2014-08-01 MED ORDER — 0.9 % SODIUM CHLORIDE (POUR BTL) OPTIME
TOPICAL | Status: DC | PRN
  Administered 2014-08-01: 1000 mL

## 2014-08-01 MED ORDER — MIDAZOLAM HCL 2 MG/2ML IJ SOLN
INTRAMUSCULAR | Status: AC
  Filled 2014-08-01: qty 2

## 2014-08-01 MED ORDER — BUPIVACAINE LIPOSOME 1.3 % IJ SUSP
20.0000 mL | Freq: Once | INTRAMUSCULAR | Status: DC
  Filled 2014-08-01: qty 20

## 2014-08-01 MED ORDER — LACTATED RINGERS IV SOLN: INTRAVENOUS | Status: DC

## 2014-08-01 MED ORDER — KETOROLAC TROMETHAMINE 30 MG/ML IJ SOLN
INTRAMUSCULAR | Status: DC | PRN
  Administered 2014-08-01: 30 mg via INTRAVENOUS

## 2014-08-01 MED ORDER — MEPERIDINE HCL 25 MG/ML IJ SOLN: 6.2500 mg | INTRAMUSCULAR | Status: DC | PRN

## 2014-08-01 MED ORDER — IBUPROFEN 600 MG PO TABS: 600.0000 mg | ORAL_TABLET | Freq: Four times a day (QID) | ORAL | Status: DC | PRN

## 2014-08-01 MED ORDER — NEOSTIGMINE METHYLSULFATE 10 MG/10ML IV SOLN
INTRAVENOUS | Status: AC
  Filled 2014-08-01: qty 1

## 2014-08-01 MED ORDER — GLYCOPYRROLATE 0.2 MG/ML IJ SOLN
INTRAMUSCULAR | Status: AC
  Filled 2014-08-01: qty 3

## 2014-08-01 MED ORDER — ONDANSETRON HCL 4 MG/2ML IJ SOLN
INTRAMUSCULAR | Status: AC
  Filled 2014-08-01: qty 2

## 2014-08-01 MED ORDER — GLYCOPYRROLATE 0.2 MG/ML IJ SOLN
INTRAMUSCULAR | Status: DC | PRN
  Administered 2014-08-01: 0.4 mg via INTRAVENOUS
  Administered 2014-08-01: 0.2 mg via INTRAVENOUS

## 2014-08-01 MED ORDER — DEXAMETHASONE SODIUM PHOSPHATE 10 MG/ML IJ SOLN
INTRAMUSCULAR | Status: DC | PRN
  Administered 2014-08-01: 10 mg via INTRAVENOUS

## 2014-08-01 MED ORDER — FENTANYL CITRATE 0.05 MG/ML IJ SOLN
INTRAMUSCULAR | Status: DC | PRN
  Administered 2014-08-01: 50 ug via INTRAVENOUS
  Administered 2014-08-01 (×2): 100 ug via INTRAVENOUS
  Administered 2014-08-01: 50 ug via INTRAVENOUS

## 2014-08-01 MED ORDER — BUPIVACAINE HCL (PF) 0.5 % IJ SOLN
INTRAMUSCULAR | Status: DC | PRN
  Administered 2014-08-01: 30 mL

## 2014-08-01 MED ORDER — ROCURONIUM BROMIDE 100 MG/10ML IV SOLN
INTRAVENOUS | Status: AC
  Filled 2014-08-01: qty 1

## 2014-08-01 MED ORDER — LACTATED RINGERS IV SOLN
INTRAVENOUS | Status: DC
  Administered 2014-08-01: 14:00:00 via INTRAVENOUS
  Administered 2014-08-01: 125 mL/h via INTRAVENOUS

## 2014-08-01 MED ORDER — METOCLOPRAMIDE HCL 5 MG/ML IJ SOLN: 10.0000 mg | Freq: Once | INTRAMUSCULAR | Status: DC | PRN

## 2014-08-01 SURGICAL SUPPLY — 45 items
BARRIER ADHS 3X4 INTERCEED (GAUZE/BANDAGES/DRESSINGS) IMPLANT
BENZOIN TINCTURE PRP APPL 2/3 (GAUZE/BANDAGES/DRESSINGS) ×6 IMPLANT
BLADE SURG 10 STRL SS (BLADE) ×6 IMPLANT
CANISTER SUCT 3000ML (MISCELLANEOUS) ×3 IMPLANT
CELLS DAT CNTRL 66122 CELL SVR (MISCELLANEOUS) IMPLANT
CHLORAPREP W/TINT 26ML (MISCELLANEOUS) IMPLANT
CLOSURE WOUND 1/2 X4 (GAUZE/BANDAGES/DRESSINGS) ×1
CLOTH BEACON ORANGE TIMEOUT ST (SAFETY) ×3 IMPLANT
CONT PATH 16OZ SNAP LID 3702 (MISCELLANEOUS) ×3 IMPLANT
DECANTER SPIKE VIAL GLASS SM (MISCELLANEOUS) IMPLANT
DRAPE WARM FLUID 44X44 (DRAPE) ×3 IMPLANT
DRSG OPSITE POSTOP 4X10 (GAUZE/BANDAGES/DRESSINGS) ×3 IMPLANT
DRSG TELFA 3X8 NADH (GAUZE/BANDAGES/DRESSINGS) ×3 IMPLANT
GAUZE SPONGE 4X4 12PLY STRL (GAUZE/BANDAGES/DRESSINGS) ×3 IMPLANT
GAUZE SPONGE 4X4 16PLY XRAY LF (GAUZE/BANDAGES/DRESSINGS) ×3 IMPLANT
GLOVE BIO SURGEON STRL SZ7 (GLOVE) ×3 IMPLANT
GLOVE BIOGEL PI IND STRL 7.0 (GLOVE) ×3 IMPLANT
GLOVE BIOGEL PI INDICATOR 7.0 (GLOVE) ×6
GOWN STRL REUS W/TWL LRG LVL3 (GOWN DISPOSABLE) ×12 IMPLANT
NEEDLE HYPO 25X1 1.5 SAFETY (NEEDLE) ×3 IMPLANT
NS IRRIG 1000ML POUR BTL (IV SOLUTION) ×6 IMPLANT
PACK ABDOMINAL GYN (CUSTOM PROCEDURE TRAY) ×3 IMPLANT
PAD ABD 7.5X8 STRL (GAUZE/BANDAGES/DRESSINGS) ×6 IMPLANT
PAD OB MATERNITY 4.3X12.25 (PERSONAL CARE ITEMS) ×3 IMPLANT
PROTECTOR NERVE ULNAR (MISCELLANEOUS) ×3 IMPLANT
RETRACTOR WND ALEXIS 25 LRG (MISCELLANEOUS) IMPLANT
RTRCTR WOUND ALEXIS 18CM MED (MISCELLANEOUS)
RTRCTR WOUND ALEXIS 25CM LRG (MISCELLANEOUS)
SPONGE LAP 18X18 X RAY DECT (DISPOSABLE) ×6 IMPLANT
STAPLER VISISTAT 35W (STAPLE) ×3 IMPLANT
STRIP CLOSURE SKIN 1/2X4 (GAUZE/BANDAGES/DRESSINGS) ×2 IMPLANT
SUT VIC AB 0 CT1 18XCR BRD8 (SUTURE) ×1 IMPLANT
SUT VIC AB 0 CT1 27 (SUTURE) ×4
SUT VIC AB 0 CT1 27XBRD ANBCTR (SUTURE) ×2 IMPLANT
SUT VIC AB 0 CT1 8-18 (SUTURE) ×2
SUT VIC AB 3-0 CT1 27 (SUTURE) ×2
SUT VIC AB 3-0 CT1 TAPERPNT 27 (SUTURE) ×1 IMPLANT
SUT VIC AB 3-0 SH 27 (SUTURE)
SUT VIC AB 3-0 SH 27X BRD (SUTURE) IMPLANT
SUT VIC AB 4-0 KS 27 (SUTURE) ×3 IMPLANT
SUT VICRYL 0 TIES 12 18 (SUTURE) IMPLANT
SYR CONTROL 10ML LL (SYRINGE) ×6 IMPLANT
TOWEL OR 17X24 6PK STRL BLUE (TOWEL DISPOSABLE) ×6 IMPLANT
TRAY FOLEY CATH 14FR (SET/KITS/TRAYS/PACK) ×3 IMPLANT
WATER STERILE IRR 1000ML POUR (IV SOLUTION) IMPLANT

## 2014-08-01 NOTE — Progress Notes (Signed)
Patient ID: Robin Larson, female   DOB: Nov 12, 1980, 34 y.o.   MRN: [id number] HPI Pt was seen in Nov in the ED and in the ofc and was dx'd with a left ov cyst. She reports that she is no longer having pain but, is worried about the reoccurrence of the pain.   Past Medical History  Diagnosis Date  . Hypertension   . Anemia    Past Surgical History  Procedure Laterality Date  . Cesarean section     Current Outpatient Prescriptions on File Prior to Visit  Medication Sig Dispense Refill  . oxyCODONE-acetaminophen (PERCOCET/ROXICET) 5-325 MG per tablet Take 2 tablets by mouth every 4 (four) hours as needed for severe pain. 15 tablet 0  . BIOTIN PO Take 1 tablet by mouth daily.    Marland Kitchen HYDROcodone-acetaminophen (NORCO/VICODIN) 5-325 MG per tablet Take 1-2 tablets by mouth every 4 (four) hours as needed for moderate pain or severe pain. (Patient not taking: Reported on 06/21/2014) 12 tablet 0  . ibuprofen (ADVIL,MOTRIN) 200 MG tablet Take 800 mg by mouth every 6 (six) hours as needed for moderate pain.    Marland Kitchen ondansetron (ZOFRAN) 4 MG tablet Take 1 tablet (4 mg total) by mouth every 6 (six) hours. (Patient not taking: Reported on 06/19/2014) 12 tablet 0   No current facility-administered medications on file prior to visit.   Allergies  Allergen Reactions  . Codeine     itching   History   Social History  . Marital Status: Single    Spouse Name: N/A    Number of Children: N/A  . Years of Education: N/A   Occupational History  . Not on file.   Social History Main Topics  . Smoking status: Never Smoker   . Smokeless tobacco: Never Used  . Alcohol Use: No  . Drug Use: No  . Sexual Activity: Yes    Birth Control/ Protection: None   Other Topics Concern  . Not on file   Social History Narrative     Review of Systems     Objective:   Physical Exam. BP 125/89 mmHg   Pulse 88  Temp(Src) 98.8 F (37.1 C) (Oral)  Resp 16  SpO2 100%  LMP 07/10/2014 (Exact Date) Exam from 1 week ago Pt in NAD Abd: well healed transverse incision. NT, ND GU: EGBUS: no lesions Vagina: no blood in vault Cervix: no lesion; no mucopurulent d/c Uterus: small, mobile Adnexa: large mass on left side. Appears to be above the uterus.    06/19/2014 EXAM: TRANSABDOMINAL AND TRANSVAGINAL ULTRASOUND OF PELVIS  TECHNIQUE: Both transabdominal and transvaginal ultrasound examinations of the pelvis were performed. Transabdominal technique was performed for global imaging of the pelvis including uterus, ovaries, adnexal regions, and pelvic cul-de-sac. It was necessary to proceed with endovaginal exam following the transabdominal exam to visualize the endometrium.  COMPARISON: CT abdomen and pelvis 06/19/2014  FINDINGS: Uterus  Measurements: 8.8 x 5.6 x 5.7 cm. Heterogeneous myometrial echogenicity. Hypoechoic nodule anterior mid uterus question leiomyoma 1.8 x 1.7 x 1.1 cm.  Endometrium  Thickness: 12 mm thick. Diffuse mild heterogeneity without endometrial fluid or focal abnormality.  Right ovary  Measurements: 3.2 x 2.4 x 2.9 cm. Normal morphology without mass. Internal blood flow present on color Doppler imaging.  Left ovary  Normal appearing LEFT ovarian tissue not identified. Large complex cystic lesion in LEFT adnexa, seen only transabdominally due to high position, measuring 11.0 x 6.0 x 9.3 cm in size, containing thick irregular septations and areas  of mild peripheral/mural nodularity. Blood flow present within some of the thick septations.  Other findings  Trace free pelvic fluid. No additional adnexal masses.  IMPRESSION: Small probable uterine leiomyoma.  Large complex cystic mass within LEFT adnexa likely of LEFT ovarian origin and measuring 11.0 x 6.0 x 9.3 cm in size, containing peripheral/mural nodularity and thick  irregular septations, some which contain internal blood flow.  Cystic ovarian neoplasm not excluded; recommend surgical assessment.  This recommendation follows the consensus statement: Management of Asymptomatic Ovarian and Other Adnexal Cysts Imaged at [place]: Society of Radiologists in [place]. Radiology 2010; [phone].  06/19/2014 EXAM: CT ABDOMEN AND PELVIS WITH CONTRAST  TECHNIQUE: Multidetector CT imaging of the abdomen and pelvis was performed using the standard protocol following bolus administration of intravenous contrast.  CONTRAST: 117mL OMNIPAQUE IOHEXOL 300 MG/ML SOLN, 72mL OMNIPAQUE IOHEXOL 300 MG/ML SOLN  COMPARISON: None.  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 No aggressive osseous lesions.  Lung Bases: Normal.  Liver: Normal.  Spleen: Normal.  Gallbladder: Normal.  Common bile duct: Normal.  Pancreas: Normal.  Adrenal glands: Normal bilaterally.  Kidneys: Normal enhancement. Both ureters appear within normal limits. No hydronephrosis. No calculi.  Stomach: Partially collapsed. No inflammatory changes.  Small bowel: Duodenum appears normal. No small bowel obstruction. No mesenteric adenopathy identified.  Colon: Large stool burden. Normal appendix. Rectosigmoid appears normal.  Pelvic Genitourinary: urinary bladder collapsed. Cystic LEFT adnexal mass is present. This measures 9.6 cm transverse by 5.7 cm AP and 11 cm craniocaudal. Multiple septations are present. Uterus grossly appears within normal limits. RIGHT adnexa abnormal.  Vasculature: Normal.  Body Wall: Tiny fat containing periumbilical hernia. Lower abdominal wall scar, probably from C-section.  IMPRESSION: Approximately 10 cm x 6 cm x 11 cm Cystic LEFT adnexal mass. Pelvic ultrasound recommended for further assessment. This  recommendation follows ACR consensus guidelines: White Paper of the ACR Incidental Findings Committee II on Adnexal Findings. J Am Coll Radiol [id number].     Assessment:     11 cm left ov cyst. Normal CA 125. Rec surgical resection. Counseled pt about the risks of surgery. There is also a small probability of malignancy.     Plan:     Patient desires surgical management with left oophorectomy vs pvarian cystectomy. The risks of surgery were discussed in detail with the patient including but not limited to: bleeding which may require transfusion or reoperation; infection which may require prolonged hospitalization or re-hospitalization and antibiotic therapy; injury to bowel, bladder, ureters and major vessels or other surrounding organs; need for additional procedures including laparotomy; thromboembolic phenomenon, incisional problems and other postoperative or anesthesia complications. Patient was told that the likelihood that her condition and symptoms will be treated effectively with this surgical management was very high; the postoperative expectations were also discussed in detail. The patient also understands the alternative treatment options which were discussed in full. All questions were answered.

## 2014-08-01 NOTE — Brief Op Note (Signed)
08/01/2014  2:48 PM  PATIENT:  Robin Larson  34 y.o. female  PRE-OPERATIVE DIAGNOSIS:  Left adnexal mass  POST-OPERATIVE DIAGNOSIS:  Left adnexal mass  PROCEDURE:  Procedure(s): LAPAROTOMY/REMOVAL OF LEFT OVARIAN CYSTECTOMY (Left) LEFT OOPHORECTOMY (Left)and left salpingectomy  SURGEON:  Surgeon(s) and Role:    * Lavonia Drafts, MD - Primary    * Mora Bellman, MD - Assisting  ANESTHESIA:   general  EBL:  Total I/O In: 1400 [I.V.:1400] Out: 260 [Urine:250; Blood:10]  BLOOD ADMINISTERED:none  DRAINS: none   LOCAL MEDICATIONS USED:  MARCAINE    and OTHER Exparel  SPECIMEN:  Source of Specimen:  left ovary with cyst  DISPOSITION OF SPECIMEN:  PATHOLOGY  COUNTS:  YES  TOURNIQUET:  * No tourniquets in log *  DICTATION: .Note written in EPIC  PLAN OF CARE: Discharge to home after PACU  PATIENT DISPOSITION:  PACU - hemodynamically stable.   Delay start of Pharmacological VTE agent (>24hrs) due to surgical blood loss or risk of bleeding: not applicable  Complications: none immediate

## 2014-08-01 NOTE — Op Note (Signed)
08/01/2014  2:48 PM  PATIENT:  Robin Larson  34 y.o. female  PRE-OPERATIVE DIAGNOSIS:  Left adnexal mass  POST-OPERATIVE DIAGNOSIS:  Left adnexal mass  PROCEDURE:  Procedure(s): LAPAROTOMY/REMOVAL OF LEFT OVARIAN CYSTECTOMY (Left) LEFT OOPHORECTOMY (Left)and left salpingectomy  SURGEON:  Surgeon(s) and Role:    * Lavonia Drafts, MD - Primary    * Mora Bellman, MD - Assisting  ANESTHESIA:   general  EBL:  Total I/O In: 1400 [I.V.:1400] Out: 260 [Urine:250; Blood:10]  BLOOD ADMINISTERED:none  DRAINS: none   LOCAL MEDICATIONS USED:  MARCAINE    and OTHER Exparel  SPECIMEN:  Source of Specimen:  left ovary with cyst  DISPOSITION OF SPECIMEN:  PATHOLOGY  COUNTS:  YES  TOURNIQUET:  * No tourniquets in log *  DICTATION: .Note written in EPIC  PLAN OF CARE: Discharge to home after PACU  PATIENT DISPOSITION:  PACU - hemodynamically stable.   Delay start of Pharmacological VTE agent (>24hrs) due to surgical blood loss or risk of bleeding: not applicable  Complications: none immediate   INDICATIONS: The patient is a 34 y.o. [id number] with  A left adnexal mass 11x6x9cm on CT scan with normal CA125 who desired definitive surgical management. On the preoperative visit, the risks, benefits, indications, and alternatives of the procedure were reviewed with the patient.  On the day of surgery, the risks of surgery were again discussed with the patient including but not limited to: bleeding which may require transfusion or reoperation; infection which may require antibiotics; injury to bowel, bladder, ureters or other surrounding organs; need for additional procedures; thromboembolic phenomenon, incisional problems and other postoperative/anesthesia complications. Written informed consent was obtained.    OPERATIVE FINDINGS: A 8 week size uterus with normal right fallopian tube and ovary.   The above reference mass was noted on the left adnexa.  There was no adhesion to the mass.   There were no other masses noted.  The liver capsule was smooth and there was no significant pelvic fluid.     DESCRIPTION OF PROCEDURE:  The patient received intravenous antibiotics and had sequential compression devices applied to her lower extremities while in the preoperative area.   She was taken to the operating room and placed under general anesthesia without difficulty.The abdomen and perineum were prepped and draped in a sterile manner, and she was placed in a dorsal supine position.  A Foley catheter was inserted into the bladder and attached to constant drainage. After an adequate timeout was performed, a Pfannensteil skin incision was made after 30cc of 0.5% Marcaine was injected. This incision was taken down to the fascia using electrocautery with care given to maintain good hemostasis. The fascia was incised in the midline and the fascial incision was then extended bilaterally using electrocautery without difficulty. The fascia was then dissected off the underlying rectus muscles using blunt and sharp dissection. The rectus muscles were split bluntly in the midline and the peritoneum entered sharply without complication. This peritoneal incision was then extended superiorly and inferiorly with care given to prevent bowel or bladder injury.  Attention was then turned to the pelvis.  An exploration of the abdomen and pelvis revealed the above findings.  The  ovary was easily identified at this point and brought through the incision. The left  infundibulopelvic ligament clamped on the patient's right side. This pedicle was then clamped, cut, and doubly suture ligated with 0 Vicryl. An attempt was initially made to save the fallopian tube but, there was bleeding in the mesosalpinx.  The pelvis was irrigated and hemostasis was reconfirmed at all pedicles.  The peritoneum was reapproximated with 0 vicryl running stitch, and the fascia was also closed in a running fashion with 0 vicryl suture. The  subcutaneous layer was irrigated and injected with 20 ml of Exparel. The skin was closed with a 4-0 Vicryl subcuticular stitch. Sponge, lap, needle, and instrument counts were correct times two. The patient was taken to the recovery area awake, extubated and in stable condition.  No retractors were needed for the case and at patients request she will be discharged post op on Percocet and Motrin.    Evangaline Jou L. Ihor Dow, MD, [place] Attending [place], [facility]

## 2014-08-01 NOTE — Anesthesia Preprocedure Evaluation (Signed)
Anesthesia Evaluation  Patient identified by MRN, date of birth, ID band Patient awake    Reviewed: Allergy & Precautions, NPO status , Patient's Chart, lab work & pertinent test results  Airway Mallampati: II  TM Distance: >3 FB Neck ROM: Full    Dental no notable dental hx. (+) Teeth Intact   Pulmonary neg pulmonary ROS,  breath sounds clear to auscultation  Pulmonary exam normal       Cardiovascular hypertension, Rhythm:Regular Rate:Normal     Neuro/Psych  Headaches, negative psych ROS   GI/Hepatic negative GI ROS, Neg liver ROS,   Endo/Other  Morbid obesity  Renal/GU negative Renal ROS  negative genitourinary   Musculoskeletal negative musculoskeletal ROS (+)   Abdominal (+) + obese,   Peds  Hematology  (+) anemia ,   Anesthesia Other Findings   Reproductive/Obstetrics Left Adnexal mass                             Anesthesia Physical Anesthesia Plan  ASA: III  Anesthesia Plan: General   Post-op Pain Management:    Induction: Intravenous  Airway Management Planned: Oral ETT  Additional Equipment:   Intra-op Plan:   Post-operative Plan: Extubation in OR  Informed Consent: I have reviewed the patients History and Physical, chart, labs and discussed the procedure including the risks, benefits and alternatives for the proposed anesthesia with the patient or authorized representative who has indicated his/her understanding and acceptance.   Dental advisory given  Plan Discussed with: Anesthesiologist, CRNA and Surgeon  Anesthesia Plan Comments:         Anesthesia Quick Evaluation

## 2014-08-01 NOTE — H&P (Signed)
Patient ID: Robin Larson, female DOB: 14-May-1981, 34 y.o. MRN: [id number] HPI Pt was seen in Nov in the ED and in the ofc and was dx'd with a left ov cyst. She reports that she is no longer having pain but, is worried about the reoccurrence of the pain.   Past Medical History  Diagnosis Date  . Hypertension   . Anemia    Past Surgical History  Procedure Laterality Date  . Cesarean section     Current Outpatient Prescriptions on File Prior to Visit  Medication Sig Dispense Refill  . oxyCODONE-acetaminophen (PERCOCET/ROXICET) 5-325 MG per tablet Take 2 tablets by mouth every 4 (four) hours as needed for severe pain. 15 tablet 0  . BIOTIN PO Take 1 tablet by mouth daily.    Marland Kitchen HYDROcodone-acetaminophen (NORCO/VICODIN) 5-325 MG per tablet Take 1-2 tablets by mouth every 4 (four) hours as needed for moderate pain or severe pain. (Patient not taking: Reported on 06/21/2014) 12 tablet 0  . ibuprofen (ADVIL,MOTRIN) 200 MG tablet Take 800 mg by mouth every 6 (six) hours as needed for moderate pain.    Marland Kitchen ondansetron (ZOFRAN) 4 MG tablet Take 1 tablet (4 mg total) by mouth every 6 (six) hours. (Patient not taking: Reported on 06/19/2014) 12 tablet 0   No current facility-administered medications on file prior to visit.   Allergies  Allergen Reactions  . Codeine     itching   History   Social History  . Marital Status: Single    Spouse Name: N/A    Number of Children: N/A  . Years of Education: N/A   Occupational History  . Not on file.   Social History Main Topics  . Smoking status: Never Smoker   . Smokeless tobacco: Never Used  . Alcohol Use: No  . Drug Use: No  . Sexual Activity: Yes    Birth Control/ Protection: None   Other Topics Concern  . Not on file    Social History Narrative     Review of Systems    Objective:  Physical Exam. BP 125/89 mmHg  Pulse 88  Temp(Src) 98.8 F (37.1 C) (Oral)  Resp 16  SpO2 100%  LMP 07/10/2014 (Exact Date) Exam from 1 week ago Pt in NAD Abd: well healed transverse incision. NT, ND GU: EGBUS: no lesions Vagina: no blood in vault Cervix: no lesion; no mucopurulent d/c Uterus: small, mobile Adnexa: large mass on left side. Appears to be above the uterus.    06/19/2014 EXAM: TRANSABDOMINAL AND TRANSVAGINAL ULTRASOUND OF PELVIS  TECHNIQUE: Both transabdominal and transvaginal ultrasound examinations of the pelvis were performed. Transabdominal technique was performed for global imaging of the pelvis including uterus, ovaries, adnexal regions, and pelvic cul-de-sac. It was necessary to proceed with endovaginal exam following the transabdominal exam to visualize the endometrium.  COMPARISON: CT abdomen and pelvis 06/19/2014  FINDINGS: Uterus  Measurements: 8.8 x 5.6 x 5.7 cm. Heterogeneous myometrial echogenicity. Hypoechoic nodule anterior mid uterus question leiomyoma 1.8 x 1.7 x 1.1 cm.  Endometrium  Thickness: 12 mm thick. Diffuse mild heterogeneity without endometrial fluid or focal abnormality.  Right ovary  Measurements: 3.2 x 2.4 x 2.9 cm. Normal morphology without mass. Internal blood flow present on color Doppler imaging.  Left ovary  Normal appearing LEFT ovarian tissue not identified. Large complex cystic lesion in LEFT adnexa, seen only transabdominally due to high position, measuring 11.0 x 6.0 x 9.3 cm in size, containing thick irregular septations and areas of mild peripheral/mural nodularity. Blood flow  present within some of the thick septations.  Other findings  Trace free pelvic fluid. No additional adnexal masses.  IMPRESSION: Small probable uterine leiomyoma.  Large complex cystic mass within LEFT adnexa likely of LEFT  ovarian origin and measuring 11.0 x 6.0 x 9.3 cm in size, containing peripheral/mural nodularity and thick irregular septations, some which contain internal blood flow.  Cystic ovarian neoplasm not excluded; recommend surgical assessment.  This recommendation follows the consensus statement: Management of Asymptomatic Ovarian and Other Adnexal Cysts Imaged at [place]: Society of Radiologists in [place]. Radiology 2010; [phone].  06/19/2014 EXAM: CT ABDOMEN AND PELVIS WITH CONTRAST  TECHNIQUE: Multidetector CT imaging of the abdomen and pelvis was performed using the standard protocol following bolus administration of intravenous contrast.  CONTRAST: 125mL OMNIPAQUE IOHEXOL 300 MG/ML SOLN, 2mL OMNIPAQUE IOHEXOL 300 MG/ML SOLN  COMPARISON: None.  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 No aggressive osseous lesions.  Lung Bases: Normal.  Liver: Normal.  Spleen: Normal.  Gallbladder: Normal.  Common bile duct: Normal.  Pancreas: Normal.  Adrenal glands: Normal bilaterally.  Kidneys: Normal enhancement. Both ureters appear within normal limits. No hydronephrosis. No calculi.  Stomach: Partially collapsed. No inflammatory changes.  Small bowel: Duodenum appears normal. No small bowel obstruction. No mesenteric adenopathy identified.  Colon: Large stool burden. Normal appendix. Rectosigmoid appears normal.  Pelvic Genitourinary: urinary bladder collapsed. Cystic LEFT adnexal mass is present. This measures 9.6 cm transverse by 5.7 cm AP and 11 cm craniocaudal. Multiple septations are present. Uterus grossly appears within normal limits. RIGHT adnexa abnormal.  Vasculature: Normal.  Body Wall: Tiny fat containing periumbilical hernia. Lower abdominal wall scar, probably from C-section.  IMPRESSION: Approximately  10 cm x 6 cm x 11 cm Cystic LEFT adnexal mass. Pelvic ultrasound recommended for further assessment. This recommendation follows ACR consensus guidelines: White Paper of the ACR Incidental Findings Committee II on Adnexal Findings. J Am Coll Radiol [phone].    Assessment:    11 cm left ov cyst. Normal CA 125. Rec surgical resection. Counseled pt about the risks of surgery. There is also a small probability of malignancy.    Plan:    Patient desires surgical management with left oophorectomy vs pvarian cystectomy. The risks of surgery were discussed in detail with the patient including but not limited to: bleeding which may require transfusion or reoperation; infection which may require prolonged hospitalization or re-hospitalization and antibiotic therapy; injury to bowel, bladder, ureters and major vessels or other surrounding organs; need for additional procedures including laparotomy; thromboembolic phenomenon, incisional problems and other postoperative or anesthesia complications. Patient was told that the likelihood that her condition and symptoms will be treated effectively with this surgical management was very high; the postoperative expectations were also discussed in detail. The patient also understands the alternative treatment options which were discussed in full. All questions were answered.          Patient ID: Robin Larson, female DOB: 02/28/1981, 34 y.o. MRN: [id number] HPI Pt was seen in Nov in the ED and in the ofc and was dx'd with a left ov cyst. She reports that she is no longer having pain but, is worried about the reoccurrence of the pain.   Past Medical History  Diagnosis Date  . Hypertension   . Anemia    Past Surgical History  Procedure Laterality Date  . Cesarean section     Current Outpatient Prescriptions on File Prior to  Visit  Medication Sig Dispense Refill  .  oxyCODONE-acetaminophen (PERCOCET/ROXICET) 5-325 MG per tablet Take 2 tablets by mouth every 4 (four) hours as needed for severe pain. 15 tablet 0  . BIOTIN PO Take 1 tablet by mouth daily.    Marland Kitchen HYDROcodone-acetaminophen (NORCO/VICODIN) 5-325 MG per tablet Take 1-2 tablets by mouth every 4 (four) hours as needed for moderate pain or severe pain. (Patient not taking: Reported on 06/21/2014) 12 tablet 0  . ibuprofen (ADVIL,MOTRIN) 200 MG tablet Take 800 mg by mouth every 6 (six) hours as needed for moderate pain.    Marland Kitchen ondansetron (ZOFRAN) 4 MG tablet Take 1 tablet (4 mg total) by mouth every 6 (six) hours. (Patient not taking: Reported on 06/19/2014) 12 tablet 0   No current facility-administered medications on file prior to visit.   Allergies  Allergen Reactions  . Codeine     itching   History   Social History  . Marital Status: Single    Spouse Name: N/A    Number of Children: N/A  . Years of Education: N/A   Occupational History  . Not on file.   Social History Main Topics  . Smoking status: Never Smoker   . Smokeless tobacco: Never Used  . Alcohol Use: No  . Drug Use: No  . Sexual Activity: Yes    Birth Control/ Protection: None   Other Topics Concern  . Not on file   Social History Narrative     Review of Systems    Objective:  Physical Exam. BP 125/89 mmHg  Pulse 88  Temp(Src) 98.8 F (37.1 C) (Oral)  Resp 16  SpO2 100%  LMP 07/10/2014 (Exact Date) Exam from 1 week ago Pt in NAD Abd: well healed transverse incision. NT, ND GU: EGBUS: no lesions Vagina: no blood in vault Cervix: no lesion; no mucopurulent d/c Uterus: small, mobile Adnexa: large mass on left side. Appears to be above the uterus.    06/19/2014 EXAM: TRANSABDOMINAL AND TRANSVAGINAL ULTRASOUND OF  PELVIS  TECHNIQUE: Both transabdominal and transvaginal ultrasound examinations of the pelvis were performed. Transabdominal technique was performed for global imaging of the pelvis including uterus, ovaries, adnexal regions, and pelvic cul-de-sac. It was necessary to proceed with endovaginal exam following the transabdominal exam to visualize the endometrium.  COMPARISON: CT abdomen and pelvis 06/19/2014  FINDINGS: Uterus  Measurements: 8.8 x 5.6 x 5.7 cm. Heterogeneous myometrial echogenicity. Hypoechoic nodule anterior mid uterus question leiomyoma 1.8 x 1.7 x 1.1 cm.  Endometrium  Thickness: 12 mm thick. Diffuse mild heterogeneity without endometrial fluid or focal abnormality.  Right ovary  Measurements: 3.2 x 2.4 x 2.9 cm. Normal morphology without mass. Internal blood flow present on color Doppler imaging.  Left ovary  Normal appearing LEFT ovarian tissue not identified. Large complex cystic lesion in LEFT adnexa, seen only transabdominally due to high position, measuring 11.0 x 6.0 x 9.3 cm in size, containing thick irregular septations and areas of mild peripheral/mural nodularity. Blood flow present within some of the thick septations.  Other findings  Trace free pelvic fluid. No additional adnexal masses.  IMPRESSION: Small probable uterine leiomyoma.  Large complex cystic mass within LEFT adnexa likely of LEFT ovarian origin and measuring 11.0 x 6.0 x 9.3 cm in size, containing peripheral/mural nodularity and thick irregular septations, some which contain internal blood flow.  Cystic ovarian neoplasm not excluded; recommend surgical assessment.  This recommendation follows the consensus statement: Management of Asymptomatic Ovarian and Other Adnexal Cysts Imaged at [place]: Society  of Radiologists in [place]trasound Consensus Conference Statement. Radiology 2010; [phone].  06/19/2014 EXAM: CT ABDOMEN AND PELVIS WITH  CONTRAST  TECHNIQUE: Multidetector CT imaging of the abdomen and pelvis was performed using the standard protocol following bolus administration of intravenous contrast.  CONTRAST: 190mL OMNIPAQUE IOHEXOL 300 MG/ML SOLN, 28mL OMNIPAQUE IOHEXOL 300 MG/ML SOLN  COMPARISON: None.  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 No aggressive osseous lesions.  Lung Bases: Normal.  Liver: Normal.  Spleen: Normal.  Gallbladder: Normal.  Common bile duct: Normal.  Pancreas: Normal.  Adrenal glands: Normal bilaterally.  Kidneys: Normal enhancement. Both ureters appear within normal limits. No hydronephrosis. No calculi.  Stomach: Partially collapsed. No inflammatory changes.  Small bowel: Duodenum appears normal. No small bowel obstruction. No mesenteric adenopathy identified.  Colon: Large stool burden. Normal appendix. Rectosigmoid appears normal.  Pelvic Genitourinary: urinary bladder collapsed. Cystic LEFT adnexal mass is present. This measures 9.6 cm transverse by 5.7 cm AP and 11 cm craniocaudal. Multiple septations are present. Uterus grossly appears within normal limits. RIGHT adnexa abnormal.  Vasculature: Normal.  Body Wall: Tiny fat containing periumbilical hernia. Lower abdominal wall scar, probably from C-section.  IMPRESSION: Approximately 10 cm x 6 cm x 11 cm Cystic LEFT adnexal mass. Pelvic ultrasound recommended for further assessment. This recommendation follows ACR consensus guidelines: White Paper of the ACR Incidental Findings Committee II on Adnexal Findings. J Am Coll Radiol [phone].    Assessment:    11 cm left ov cyst. Normal CA 125. Rec surgical resection. Counseled pt about the risks of surgery. There is also a small probability of malignancy.    Plan:    Patient desires surgical management with left  oophorectomy vs pvarian cystectomy. The risks of surgery were discussed in detail with the patient including but not limited to: bleeding which may require transfusion or reoperation; infection which may require prolonged hospitalization or re-hospitalization and antibiotic therapy; injury to bowel, bladder, ureters and major vessels or other surrounding organs; need for additional procedures including laparotomy; thromboembolic phenomenon, incisional problems and other postoperative or anesthesia complications. Patient was told that the likelihood that her condition and symptoms will be treated effectively with this surgical management was very high; the postoperative expectations were also discussed in detail. The patient also understands the alternative treatment options which were discussed in full. All questions were answered.

## 2014-08-01 NOTE — Transfer of Care (Signed)
Immediate Anesthesia Transfer of Care Note  Patient: Robin Larson  Procedure(s) Performed: Procedure(s): LAPAROTOMY/REMOVAL OF LEFT OVARIAN CYSTECTOMY (Left) LEFT OOPHORECTOMY (Left)  Patient Location: PACU  Anesthesia Type:General  Level of Consciousness: awake, alert  and oriented  Airway & Oxygen Therapy: Patient Spontanous Breathing and Patient connected to nasal cannula oxygen  Post-op Assessment: Report given to PACU RN, Post -op Vital signs reviewed and stable and Patient moving all extremities X 4  Post vital signs: Reviewed and stable  Complications: No apparent anesthesia complications

## 2014-08-01 NOTE — Discharge Instructions (Signed)
Bilateral Salpingo-Oophorectomy Bilateral salpingo-oophorectomy is the surgical removal of both fallopian tubes and both ovaries. The ovaries are small organs that produce eggs in women. The fallopian tubes transport the egg from the ovary to the womb (uterus). Usually, when this surgery is done, the uterus was previously removed. A bilateral salpingo-oophorectomy may be done to treat cancer or to reduce the risk of cancer in women who are at high risk. Removing both fallopian tubes and both ovaries will make you unable to become pregnant (sterile). It will also put you into menopause so that you will no longer have menstrual periods and may have menopausal symptoms such as hot flashes, night sweats, and mood changes. It will not affect your sex drive. LET [facility] CARE PROVIDER KNOW ABOUT:  Any allergies you have.  All medicines you are taking, including vitamins, herbs, eye drops, creams, and over-the-counter medicines.  Previous problems you or members of your family have had with the use of anesthetics.  Any blood disorders you have.  Previous surgeries you have had.  Medical conditions you have. RISKS AND COMPLICATIONS Generally, this is a safe procedure. However, as with any procedure, complications can occur. Possible complications include:  Injury to surrounding organs.  Bleeding.  Infection.  Blood clots in the legs or lungs.  Problems related to anesthesia. BEFORE THE PROCEDURE  Ask your health care provider about changing or stopping your regular medicines. You may need to stop taking certain medicines, such as aspirin or blood thinners, at least 1 week before the surgery.  Do not eat or drink anything for at least 8 hours before the surgery.  If you smoke, do not smoke for at least 2 weeks before the surgery.  Make plans to have someone drive you home after the procedure or after your hospital stay. Also arrange for someone to help you with activities during  recovery. PROCEDURE   You will be given medicine to help you relax before the procedure (sedative). You will then be given medicine to make you sleep through the procedure (general anesthetic). These medicines will be given through an IV access tube that is put into one of your veins.  Once you are asleep, your lower abdomen will be shaved and cleaned. A thin, flexible tube (catheter) will be placed in your bladder.  The surgeon may use a laparoscopic, robotic, or open technique for this surgery:  In the laparoscopic technique, the surgery is done through two small cuts (incisions) in the abdomen. A thin, lighted tube with a tiny camera on the end (laparoscope) is inserted into one of the incisions. The tools needed for the procedure are put through the other incision.  A robotic technique may be chosen to perform complex surgery in a small space. In the robotic technique, small incisions will be made. A camera and surgical instruments are passed through the incisions. Surgical instruments will be controlled with the help of a robotic arm.  In the open technique, the surgery is done through one large incision in the abdomen.  Using any of these techniques, the surgeon removes the fallopian tubes and ovaries. The blood vessels will be clamped and tied.  The surgeon then uses staples or stitches to close the incision or incisions. AFTER THE PROCEDURE  You will be taken to a recovery area where you will be monitored for 1 to 3 hours. Your blood pressure, pulse, and temperature will be checked often. You will remain in the recovery area until you are stable and waking  up.  If the laparoscopic technique was used, you may be allowed to go home after several hours. You may have some shoulder pain after the laparoscopic procedure. This is normal and usually goes away in a day or two.  If the open technique was used, you will be admitted to the hospital for a couple of days.  You will be given pain  medicine as needed.  The IV access tube and catheter will be removed before you are discharged. Document Released: 07/14/2005 Document Revised: 07/19/2013 Document Reviewed: 01/05/2013 [facility] Patient Information 2015 [place], [place]. This information is not intended to replace advice given to you by your health care provider. Make sure you discuss any questions you have with your health care provider. Bilateral Salpingo-Oophorectomy, Care After Refer to this sheet in the next few weeks. These instructions provide you with information on caring for yourself after your procedure. Your health care provider may also give you more specific instructions. Your treatment has been planned according to current medical practices, but problems sometimes occur. Call your health care provider if you have any problems or questions after your procedure. WHAT TO EXPECT AFTER THE PROCEDURE After your procedure, it is typical to have the following:   Abdominal pain that can be controlled with medicine.  Vaginal spotting.  Constipation.  Menopausal symptoms such as hot flashes, vaginal dryness, and mood swings. HOME CARE INSTRUCTIONS   Get plenty of rest and sleep.  Only take over-the-counter or prescription medicines as directed by your health care provider. Do not take aspirin. It can cause bleeding.  Keep incision areas clean and dry. Remove or change bandages (dressings) only as directed by your health care provider.  Take showers instead of baths for a few weeks as directed by your health care provider.  Limit exercise and activities as directed by your health care provider. Do not lift anything heavier than 5 pounds (2.3 kg) until your health care provider approves.  Do not drive until your health care provider approves.  Follow your health care provider's advice regarding diet. You may be able to resume your usual diet right away.  Drink enough fluids to keep your urine clear or pale  yellow.  Do not douche, use tampons, or have sexual intercourse for 6 weeks after the procedure.  Do not drink alcohol until your health care provider says it is okay.  Take your temperature twice a day and write it down.  If you become constipated, you may:  Ask your health care provider about taking a mild laxative.  Add more fruit and bran to your diet.  Drink more fluids.  Follow up with your health care provider as directed. SEEK MEDICAL CARE IF:   You have swelling, redness, or increasing pain in the incision area.  You see pus coming from the incision area.  You notice a bad smell coming from the wound or dressing.  You have pain, redness, or swelling where the IV access tube was placed.  Your incision is breaking open (the edges are not staying together).  You feel dizzy or feel like fainting.  You develop pain or bleeding when you urinate.  You develop diarrhea.  You develop nausea and vomiting.  You develop abnormal vaginal discharge.  You develop a rash.  You have pain that is not controlled with medicine. SEEK IMMEDIATE MEDICAL CARE IF:   You develop a fever.  You develop abdominal pain.  You have chest pain.  You develop shortness of breath.  You  pass out.  You develop pain, swelling, or redness in your leg.  You develop heavy vaginal bleeding with or without blood clots. Document Released: 07/14/2005 Document Revised: 03/16/2013 Document Reviewed: 01/05/2013 [facility] Patient Information 2015 [place], [place]. This information is not intended to replace advice given to you by your health care provider. Make sure you discuss any questions you have with your health care provider. Laparotomy Care After Refer to this sheet in the next few weeks. These instructions provide you with information on caring for yourself after your procedure. Your caregiver may also give you more specific instructions. Your treatment has been planned according to current  medical practices, but problems sometimes occur. Call your caregiver if you have any problems or questions after your procedure. HOME CARE INSTRUCTIONS ACTIVITY  Rest as much as possible the first two weeks at home.  Avoid strenuous activity such as heavy lifting (more than 10 pounds), pushing, or pulling. Limit stair climbing to once or twice a day for the first week, then slowly increase this activity.  Take frequent rest periods throughout the day.  Talk with your caregiver about when you may resume your usual physical activity.  You need to be out of bed and walking as much as possible. This decreases the chance of:  Blood clots.  Pneumonia. NUTRITION  You can resume your normal diet once you regain bowel function.  Drink plenty of fluids (6-8 glasses a day or as instructed by your caregiver).  Eat a well-balanced diet.  Daily portions of food from the meat (protein), milk, vegetable, and bread groups are necessary for your health. ELIMINATION It is very important not to strain during bowel movements. If constipation should occur, you may:  Take a mild laxative.  Add fruit and bran to your diet.  Drink more fluids. HYGIENE  Take showers, not baths, until 4-6 weeks after surgery.  If your incision is closed, you may take a shower or tub bath. FEVER If you feel feverish or have shaking chills, take your temperature. If it is 102 F (38.9 C), call your caregiver. The fever may mean there is an infection. PAIN CONTROL  Mild discomfort may occur.  Only take over-the-counter or prescription medicines for pain, discomfort, or fever as directed by your caregiver. Take any prescribed medicines exactly as directed. INCISION CARE  Keep your incision site clean with soap and water.  Do not use a dressing unless your cut (incision) from surgery is draining or irritated.  If you have small adhesive strips in place and they do not fall off within 10 days, carefully peel them  off.  Check your incision and surrounding area daily for any redness, swelling, discoloration, heavy drainage, or separation of the skin. SEXUAL INTERCOURSE Do not have sexual intercourse until after your follow-up appointment, unless your caregiver tells you otherwise. SEEK MEDICAL CARE IF:   You are unable to tolerate food or drinks.  You are unable to pass gas or have a bowel movement.  Your pain becomes more severe or is not relieved with medicines.  You have redness, swelling, discoloration, heavy drainage, or separation of the skin at the incision site. Document Released: 02/26/2004 Document Revised: 06/30/2012 Document Reviewed: 07/13/2007 [facility] Patient Information 2015 [place], [place]. This information is not intended to replace advice given to you by your health care provider. Make sure you discuss any questions you have with your health care provider.  Post Anesthesia Home Care Instructions  Activity: Get plenty of rest for the remainder of  the day. A responsible adult should stay with you for 24 hours following the procedure.  For the next 24 hours, DO NOT: -Drive a car -Paediatric nurse -Drink alcoholic beverages -Take any medication unless instructed by your physician -Make any legal decisions or sign important papers.  Meals: Start with liquid foods such as gelatin or soup. Progress to regular foods as tolerated. Avoid greasy, spicy, heavy foods. If nausea and/or vomiting occur, drink only clear liquids until the nausea and/or vomiting subsides. Call your physician if vomiting continues.  Special Instructions/Symptoms: Your throat may feel dry or sore from the anesthesia or the breathing tube placed in your throat during surgery. If this causes discomfort, gargle with warm salt water. The discomfort should disappear within 24 hours.

## 2014-08-01 NOTE — Anesthesia Postprocedure Evaluation (Signed)
  Anesthesia Post-op Note  Patient: Robin Larson  Procedure(s) Performed: Procedure(s): LAPAROTOMY/REMOVAL OF LEFT OVARIAN CYSTECTOMY (Left) LEFT OOPHORECTOMY (Left) Patient is awake and responsive. Pain and nausea are reasonably well controlled. Vital signs are stable and clinically acceptable. Oxygen saturation is clinically acceptable. There are no apparent anesthetic complications at this time. Patient is ready for discharge.

## 2014-08-02 ENCOUNTER — Telehealth: Payer: Self-pay | Admitting: Obstetrics & Gynecology

## 2014-08-02 NOTE — Telephone Encounter (Signed)
TC to pt to check on her post op. Left message on pts voicemail.  Rahshawn Remo L. Harraway-Smith, M.D., Cherlynn June

## 2014-08-03 ENCOUNTER — Encounter (HOSPITAL_COMMUNITY): Payer: Self-pay | Admitting: Obstetrics & Gynecology

## 2014-08-09 ENCOUNTER — Ambulatory Visit: Payer: 59 | Admitting: Obstetrics & Gynecology

## 2014-08-10 ENCOUNTER — Encounter: Payer: Self-pay | Admitting: Obstetrics & Gynecology

## 2014-08-10 ENCOUNTER — Ambulatory Visit (INDEPENDENT_AMBULATORY_CARE_PROVIDER_SITE_OTHER): Payer: 59 | Admitting: Obstetrics & Gynecology

## 2014-08-10 VITALS — BP 117/63 | HR 75 | Temp 98.4°F | Ht 64.0 in | Wt 242.8 lb

## 2014-08-10 DIAGNOSIS — Z9889 Other specified postprocedural states: Secondary | ICD-10-CM

## 2014-08-10 NOTE — Patient Instructions (Signed)
Unilateral Salpingo-Oophorectomy, Care After Refer to this sheet in the next few weeks. These instructions provide you with information on caring for yourself after your procedure. Your health care provider may also give you more specific instructions. Your treatment has been planned according to current medical practices, but problems sometimes occur. Call your health care provider if you have any problems or questions after your procedure. WHAT TO EXPECT AFTER THE PROCEDURE After your procedure, it is typical to have the following:  Abdominal pain that can be controlled with pain medicine.  Vaginal spotting.  Constipation. HOME CARE INSTRUCTIONS   Get plenty of rest and sleep.  Only take over-the-counter or prescription medicines as directed by your health care provider. Do not take aspirin. It can cause bleeding.  Keep incision areas clean and dry. Remove or change any bandages (dressings) only as directed by your health care provider.  Follow your health care provider's advice regarding diet.  Drink enough fluids to keep your urine clear or pale yellow.  Limit exercise and activities as directed by your health care provider. Do not lift anything heavier than 5 pounds (2.3 kg) until your health care provider approves.  Do not drive until your health care provider approves.  Do not drink alcohol until your health care provider approves.  Do not have sexual intercourse until your health care provider says it is OK.  Take your temperature twice a day and write it down.  If you become constipated, you may:  Ask your health care provider about taking a mild laxative.  Add more fruit and bran to your diet.  Drink more fluids.  Follow up with your health care provider as directed. SEEK MEDICAL CARE IF:   You have swelling or redness in the incision area.  You develop a rash.  You feel lightheaded.  You have pain that is not controlled with medicine.  You have pain,  swelling, or redness where the IV access tube was placed. SEEK IMMEDIATE MEDICAL CARE IF:  You have a fever.  You develop increasing abdominal pain.  You see pus coming out of the incision, or the incision is separating.  You notice a bad smell coming from the wound or dressing.  You have excessive vaginal bleeding.  You feel sick to your stomach (nauseous) and vomit.  You have leg or chest pain.  You have pain when you urinate.  You develop shortness of breath.  You pass out. Document Released: 05/10/2009 Document Revised: 05/04/2013 Document Reviewed: 01/05/2013 [facility] Patient Information 2015 [place], [place]. This information is not intended to replace advice given to you by your health care provider. Make sure you discuss any questions you have with your health care provider.

## 2014-08-10 NOTE — Progress Notes (Signed)
Subjective:     Patient ID: Robin Larson, female   DOB: 1980/08/12, 34 y.o.   MRN: [id number]  HPI Pt is here to f/u for her  1 week post op check.  She reports good pain control. She denies N/V.      Review of Systems     Objective:   Physical Exam BP 117/63 mmHg  Pulse 75  Temp(Src) 98.4 F (36.9 C) (Oral)  Ht 5\' 4"  (1.626 m)  Wt 242 lb 12.8 oz (110.133 kg)  BMI 41.66 kg/m2  LMP 07/10/2014 (Exact Date) Abd: obese, NT, ND Incision: clean,  Dry and intact.  Steri-strips in place. 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 BENIGN FALLOPIAN TUBAL TISSUE, NO ATYPIA OR MALIGNANCY.     Assessment:     1 week post op check.     Plan:     F/u 5 weeks    gradual increase in activitiy

## 2014-08-16 ENCOUNTER — Telehealth: Payer: Self-pay | Admitting: *Deleted

## 2014-08-16 NOTE — Telephone Encounter (Signed)
Pt left message stating that she had surgery 2 weeks ago. One side of her abdomen is numb and she wants to know if this is normal.

## 2014-08-16 NOTE — Telephone Encounter (Signed)
Called [facility] and we discussed it is normal to have area of numbness on abdomen when you have had surgery- that as nerves/ wound / muscles heal sensation usually returns- but time varies. Will discuss with provider at her 6 week post op visit 08/31/14.

## 2014-08-21 ENCOUNTER — Telehealth (HOSPITAL_COMMUNITY): Payer: Self-pay | Admitting: *Deleted

## 2014-08-21 NOTE — Telephone Encounter (Signed)
Patient returned call. Patient is not eligible for [facility] due to having insurance. Will schedule colpo when she goes for follow up appointment at [facility].

## 2014-08-21 NOTE — Telephone Encounter (Signed)
Telephoned patient at home # and left message to return call to [facility] 

## 2014-08-31 ENCOUNTER — Encounter: Payer: Self-pay | Admitting: Obstetrics & Gynecology

## 2014-08-31 ENCOUNTER — Ambulatory Visit (INDEPENDENT_AMBULATORY_CARE_PROVIDER_SITE_OTHER): Payer: 59 | Admitting: Obstetrics & Gynecology

## 2014-08-31 VITALS — BP 116/94 | HR 81 | Temp 99.1°F | Ht 64.0 in | Wt 243.2 lb

## 2014-08-31 DIAGNOSIS — Z9889 Other specified postprocedural states: Secondary | ICD-10-CM

## 2014-08-31 MED ORDER — HYDROXYZINE HCL 25 MG PO TABS: 25.0000 mg | ORAL_TABLET | Freq: Four times a day (QID) | ORAL | Status: DC | PRN

## 2014-08-31 NOTE — Progress Notes (Signed)
Subjective:     Patient ID: Robin Larson, female   DOB: [birth year], 34 y.o.   MRN: [id number]  HPI Pt presents for post op check. She reports a rash after taking Percocet. She is voiding and passing stools without difficulty.  She has adequate pain control.   Review of Systems     Objective:   Physical Exam BP 116/94 mmHg  Pulse 81  Temp(Src) 99.1 F (37.3 C)  Ht 5\' 4"  (1.626 m)  Wt 243 lb 3.2 oz (110.315 kg)  BMI 41.72 kg/m2  LMP 08/07/2014  Skin: rash on back and sides and upper thighs.  Appears to be a resolving reaction to med.  Dry skin throughout. Abd: soft, NT; ND. Incision: healing without difficulty.     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 BENIGN FALLOPIAN TUBAL TISSUE, NO ATYPIA OR MALIGNANCY.     Assessment:     Allergic reaction to Percocet Doing well post op Abnormal PAP     Plan:     Vistaril 25mg  q 6 hour prn itching Reviewed moisturizer for skin    Needs appt for Colpo

## 2014-08-31 NOTE — Patient Instructions (Signed)
Neutrogena Light sesame oil Mixed Vaseline Intensive care (cocoa butter) or Eucerin cream or lotion

## 2014-08-31 NOTE — Progress Notes (Signed)
Patient ID: Robin Larson, female   DOB: 11-24-1980, 34 y.o.   MRN: [id number] Pt has a rash on abd bilaterally and her back that she is concerned about.

## 2014-09-07 ENCOUNTER — Encounter: Payer: Self-pay | Admitting: Obstetrics & Gynecology

## 2014-10-09 ENCOUNTER — Encounter: Payer: 59 | Admitting: Obstetrics & Gynecology

## 2014-11-06 ENCOUNTER — Encounter: Payer: 59 | Admitting: Obstetrics & Gynecology

## 2014-11-06 ENCOUNTER — Telehealth: Payer: Self-pay | Admitting: *Deleted

## 2014-11-06 NOTE — Telephone Encounter (Signed)
Robin Larson missed an appointment for a colposcopy. Called [facility] and notified her, she states she overslept because she was out of town and just returned. Would like to reschedule. I informed her I would have registars to call her with a new appointment.

## 2014-11-29 ENCOUNTER — Encounter: Payer: 59 | Admitting: Obstetrics & Gynecology

## 2014-12-08 ENCOUNTER — Encounter: Payer: 59 | Admitting: Obstetrics & Gynecology

## 2014-12-08 ENCOUNTER — Telehealth: Payer: Self-pay

## 2014-12-08 NOTE — Telephone Encounter (Signed)
Patient missed colpo appointment today. Second missed appointment. Attempted to contact patient. No answer. Left message stating we are sorry you missed your appointment today, please call clinic to reschedule. Will send letter.

## 2015-06-03 ENCOUNTER — Emergency Department (HOSPITAL_COMMUNITY)
Admission: EM | Admit: 2015-06-03 | Discharge: 2015-06-03 | Disposition: A | Discharge status: Home / Self Care | Payer: 59 | Attending: Emergency Medicine | Admitting: Emergency Medicine

## 2015-06-03 ENCOUNTER — Encounter (HOSPITAL_COMMUNITY): Payer: Self-pay | Admitting: Emergency Medicine

## 2015-06-03 DIAGNOSIS — R5383 Other fatigue: Secondary | ICD-10-CM | POA: Insufficient documentation

## 2015-06-03 DIAGNOSIS — Z3202 Encounter for pregnancy test, result negative: Secondary | ICD-10-CM | POA: Insufficient documentation

## 2015-06-03 DIAGNOSIS — R42 Dizziness and giddiness: Secondary | ICD-10-CM | POA: Diagnosis not present

## 2015-06-03 DIAGNOSIS — R102 Pelvic and perineal pain: Secondary | ICD-10-CM | POA: Diagnosis present

## 2015-06-03 DIAGNOSIS — Z8742 Personal history of other diseases of the female genital tract: Secondary | ICD-10-CM | POA: Insufficient documentation

## 2015-06-03 DIAGNOSIS — N760 Acute vaginitis: Secondary | ICD-10-CM | POA: Insufficient documentation

## 2015-06-03 DIAGNOSIS — Z862 Personal history of diseases of the blood and blood-forming organs and certain disorders involving the immune mechanism: Secondary | ICD-10-CM | POA: Diagnosis not present

## 2015-06-03 DIAGNOSIS — B9689 Other specified bacterial agents as the cause of diseases classified elsewhere: Secondary | ICD-10-CM

## 2015-06-03 DIAGNOSIS — R140 Abdominal distension (gaseous): Secondary | ICD-10-CM | POA: Diagnosis not present

## 2015-06-03 DIAGNOSIS — I10 Essential (primary) hypertension: Secondary | ICD-10-CM | POA: Insufficient documentation

## 2015-06-03 DIAGNOSIS — N938 Other specified abnormal uterine and vaginal bleeding: Secondary | ICD-10-CM | POA: Diagnosis not present

## 2015-06-03 HISTORY — DX: Unspecified ovarian cyst, unspecified side: N83.209

## 2015-06-03 LAB — COMPREHENSIVE METABOLIC PANEL
ALT: 11 U/L — ABNORMAL LOW (ref 14–54)
ANION GAP: 8 (ref 5–15)
AST: 16 U/L (ref 15–41)
Albumin: 3.6 g/dL (ref 3.5–5.0)
Alkaline Phosphatase: 45 U/L (ref 38–126)
BUN: 13 mg/dL (ref 6–20)
CO2: 24 mmol/L (ref 22–32)
Calcium: 8.6 mg/dL — ABNORMAL LOW (ref 8.9–10.3)
Chloride: 109 mmol/L (ref 101–111)
Creatinine, Ser: 0.89 mg/dL (ref 0.44–1.00)
GFR calc Af Amer: >60 mL/min (ref >60)
Glucose, Bld: 92 mg/dL (ref 65–99)
POTASSIUM: 3.7 mmol/L (ref 3.5–5.1)
Sodium: 141 mmol/L (ref 135–145)
Total Bilirubin: 0.3 mg/dL (ref 0.3–1.2)
Total Protein: 6.5 g/dL (ref 6.5–8.1)

## 2015-06-03 LAB — URINALYSIS, ROUTINE W REFLEX MICROSCOPIC
GLUCOSE, UA: NEGATIVE mg/dL
Hgb urine dipstick: NEGATIVE
Ketones, ur: 15 mg/dL — AB
Nitrite: NEGATIVE
PH: 6 (ref 5.0–8.0)
PROTEIN: NEGATIVE mg/dL
Specific Gravity, Urine: 1.029 (ref 1.005–1.030)
Urobilinogen, UA: 1 mg/dL (ref 0.0–1.0)

## 2015-06-03 LAB — CBC
HCT: 32.9 % — ABNORMAL LOW (ref 36.0–46.0)
Hemoglobin: 10.2 g/dL — ABNORMAL LOW (ref 12.0–15.0)
MCH: 19 pg — AB (ref 26.0–34.0)
MCHC: 31 g/dL (ref 30.0–36.0)
MCV: 61.4 fL — AB (ref 78.0–100.0)
Platelets: 223 10*3/uL (ref 150–400)
RBC: 5.36 MIL/uL — ABNORMAL HIGH (ref 3.87–5.11)
RDW: 19.2 % — ABNORMAL HIGH (ref 11.5–15.5)
WBC: 6.1 10*3/uL (ref 4.0–10.5)

## 2015-06-03 LAB — POC OCCULT BLOOD, ED: Fecal Occult Bld: NEGATIVE

## 2015-06-03 LAB — PROTIME-INR
INR: 1.14 (ref 0.00–1.49)
Prothrombin Time: 14.8 seconds (ref 11.6–15.2)

## 2015-06-03 LAB — LIPASE, BLOOD: LIPASE: 36 U/L (ref 11–51)

## 2015-06-03 LAB — URINE MICROSCOPIC-ADD ON

## 2015-06-03 LAB — WET PREP, GENITAL
Trich, Wet Prep: NONE SEEN
YEAST WET PREP: NONE SEEN

## 2015-06-03 LAB — POC URINE PREG, ED: PREG TEST UR: NEGATIVE

## 2015-06-03 LAB — APTT: aPTT: 32 seconds (ref 24–37)

## 2015-06-03 MED ORDER — KETOROLAC TROMETHAMINE 30 MG/ML IJ SOLN
30.0000 mg | Freq: Once | INTRAMUSCULAR | Status: AC
  Administered 2015-06-03: 30 mg via INTRAVENOUS
  Filled 2015-06-03: qty 1

## 2015-06-03 MED ORDER — HYDROCODONE-ACETAMINOPHEN 5-325 MG PO TABS: 1.0000 | ORAL_TABLET | ORAL | Status: DC | PRN

## 2015-06-03 MED ORDER — DOXYCYCLINE HYCLATE 100 MG PO CAPS: 100.0000 mg | ORAL_CAPSULE | Freq: Two times a day (BID) | ORAL | Status: DC

## 2015-06-03 MED ORDER — AZITHROMYCIN 1 G PO PACK
1.0000 g | PACK | Freq: Once | ORAL | Status: AC
  Administered 2015-06-03: 1 g via ORAL
  Filled 2015-06-03: qty 1

## 2015-06-03 MED ORDER — CEFTRIAXONE SODIUM 250 MG IJ SOLR
250.0000 mg | Freq: Once | INTRAMUSCULAR | Status: AC
  Administered 2015-06-03: 250 mg via INTRAMUSCULAR
  Filled 2015-06-03: qty 250

## 2015-06-03 MED ORDER — METRONIDAZOLE 500 MG PO TABS: 500.0000 mg | ORAL_TABLET | Freq: Two times a day (BID) | ORAL | Status: DC

## 2015-06-03 MED ORDER — NORGESTIMATE-ETH ESTRADIOL 0.25-35 MG-MCG PO TABS: 1.0000 | ORAL_TABLET | Freq: Every day | ORAL | Status: DC

## 2015-06-03 MED ORDER — ONDANSETRON HCL 4 MG/2ML IJ SOLN
4.0000 mg | Freq: Once | INTRAMUSCULAR | Status: AC
  Administered 2015-06-03: 4 mg via INTRAVENOUS
  Filled 2015-06-03: qty 2

## 2015-06-03 MED ORDER — LIDOCAINE HCL (PF) 1 % IJ SOLN
5.0000 mL | Freq: Once | INTRAMUSCULAR | Status: AC
  Administered 2015-06-03: 5 mL

## 2015-06-03 MED ORDER — ONDANSETRON HCL 4 MG PO TABS: 4.0000 mg | ORAL_TABLET | Freq: Four times a day (QID) | ORAL | Status: DC

## 2015-06-03 MED ORDER — IBUPROFEN 400 MG PO TABS: 400.0000 mg | ORAL_TABLET | Freq: Four times a day (QID) | ORAL | Status: DC | PRN

## 2015-06-03 MED ORDER — HYDROCODONE-ACETAMINOPHEN 5-325 MG PO TABS
1.0000 | ORAL_TABLET | Freq: Once | ORAL | Status: AC
  Administered 2015-06-03: 1 via ORAL
  Filled 2015-06-03: qty 1

## 2015-06-03 MED ORDER — SODIUM CHLORIDE 0.9 % IV BOLUS (SEPSIS)
1000.0000 mL | Freq: Once | INTRAVENOUS | Status: AC
  Administered 2015-06-03: 1000 mL via INTRAVENOUS

## 2015-06-03 MED ORDER — LIDOCAINE HCL (PF) 1 % IJ SOLN
INTRAMUSCULAR | Status: AC
  Administered 2015-06-03: 5 mL
  Filled 2015-06-03: qty 5

## 2015-06-03 NOTE — ED Notes (Signed)
MD at bedside. 

## 2015-06-03 NOTE — ED Provider Notes (Signed)
CSN: [id number]     Arrival date & time 06/03/15  1942 History   First MD Initiated Contact with Patient 06/03/15 2001     Chief Complaint  Patient presents with  . Pelvic Pain     (Consider location/radiation/quality/duration/timing/severity/associated sxs/prior Treatment) Patient is a 34 y.o. female presenting with vaginal bleeding. The history is provided by the patient and medical records.  Vaginal Bleeding Quality:  Heavier than menses Severity:  Moderate Onset quality:  Gradual Timing:  Intermittent Progression:  Waxing and waning Chronicity:  Recurrent Menstrual history:  Irregular Possible pregnancy: no   Context: at rest and spontaneously   Relieved by:  None tried Worsened by:  Nothing tried Ineffective treatments:  None tried Associated symptoms: abdominal pain, dizziness and fatigue   Associated symptoms: no back pain, no dyspareunia, no dysuria, no fever, no nausea and no vaginal discharge   Risk factors: gynecological surgery and ovarian cysts   Risk factors: no bleeding disorder, no hx of endometriosis, no ovarian torsion, no PID and no STD     Past Medical History  Diagnosis Date  . Anemia   . Hypertension     no meds since approx 2 yrs ago 2013  . Headache   . Ovarian cyst    Past Surgical History  Procedure Laterality Date  . Cesarean section  2001, 2004    x 2  . Tubal ligation    . Laparotomy Left 08/01/2014    Procedure: LAPAROTOMY/REMOVAL OF LEFT OVARIAN CYSTECTOMY;  Surgeon: Lavonia Drafts, MD;  Location: [facility];  Service: Gynecology;  Laterality: Left;  . Oophorectomy Left 08/01/2014    Procedure: LEFT OOPHORECTOMY;  Surgeon: Lavonia Drafts, MD;  Location: [facility];  Service: Gynecology;  Laterality: Left;  . Ovarian cyst removal     No family history on file. Social History  Substance Use Topics  . Smoking status: Never Smoker   . Smokeless tobacco: Never Used  . Alcohol Use: No   OB History    Gravida Para Term Preterm AB  TAB SAB Ectopic Multiple Living   2 2 1 1     1 3      Review of Systems  Constitutional: Positive for fatigue. Negative for fever.  HENT: Negative for facial swelling.   Respiratory: Negative for shortness of breath.   Cardiovascular: Negative for chest pain.  Gastrointestinal: Positive for abdominal pain and abdominal distention. Negative for nausea, vomiting, diarrhea, constipation and blood in stool.  Genitourinary: Positive for vaginal bleeding and pelvic pain. Negative for dysuria, hematuria, flank pain, vaginal discharge, difficulty urinating, vaginal pain and dyspareunia.  Musculoskeletal: Negative for back pain.  Skin: Negative for rash.  Neurological: Positive for dizziness. Negative for headaches.  Psychiatric/Behavioral: Negative for confusion.      Allergies  Percocet and Codeine  Home Medications   Prior to Admission medications   Medication Sig Start Date End Date Taking? Authorizing Provider  acetaminophen (TYLENOL) 500 MG tablet Take 1,000-1,500 mg by mouth every 4 (four) hours as needed (pain).   Yes Historical Provider, MD  aspirin-acetaminophen-caffeine (EXCEDRIN MIGRAINE) [phone] MG per tablet Take 2 tablets by mouth every 6 (six) hours as needed for headache or migraine.    Yes Historical Provider, MD  doxycycline (VIBRAMYCIN) 100 MG capsule Take 1 capsule (100 mg total) by mouth 2 (two) times daily. 06/03/15   Hoyle Sauer, MD  HYDROcodone-acetaminophen (NORCO/VICODIN) 5-325 MG tablet Take 1 tablet by mouth every 4 (four) hours as needed for severe pain. 06/03/15   Hoyle Sauer,  MD  hydrOXYzine (ATARAX/VISTARIL) 25 MG tablet Take 1 tablet (25 mg total) by mouth every 6 (six) hours as needed for itching. Patient not taking: Reported on 06/03/2015 08/31/14   Lavonia Drafts, MD  ibuprofen (ADVIL,MOTRIN) 400 MG tablet Take 1 tablet (400 mg total) by mouth every 6 (six) hours as needed for mild pain, moderate pain or cramping. 06/03/15   Hoyle Sauer, MD   metroNIDAZOLE (FLAGYL) 500 MG tablet Take 1 tablet (500 mg total) by mouth 2 (two) times daily. 06/03/15   Hoyle Sauer, MD  norgestimate-ethinyl estradiol (ORTHO-CYCLEN,SPRINTEC,PREVIFEM) 0.25-35 MG-MCG tablet Take 1 tablet by mouth daily. 06/03/15   Hoyle Sauer, MD  ondansetron (ZOFRAN) 4 MG tablet Take 1 tablet (4 mg total) by mouth every 6 (six) hours. 06/03/15   Hoyle Sauer, MD   BP 106/49 mmHg  Pulse 79  Temp(Src) 98.2 F (36.8 C) (Oral)  Resp 20  Wt 242 lb 3 oz (109.856 kg)  SpO2 100%  LMP 05/24/2015 Physical Exam  Constitutional: She is oriented to person, place, and time. She appears well-developed and well-nourished. No distress.  HENT:  Head: Normocephalic and atraumatic.  Right Ear: External ear normal.  Left Ear: External ear normal.  Nose: Nose normal.  Mouth/Throat: Oropharynx is clear and moist. No oropharyngeal exudate.  Eyes: Conjunctivae and EOM are normal. Pupils are equal, round, and reactive to light. Right eye exhibits no discharge. Left eye exhibits no discharge. No scleral icterus.  Neck: Normal range of motion. Neck supple. No JVD present. No tracheal deviation present. No thyromegaly present.  Cardiovascular: Normal rate, regular rhythm and intact distal pulses.   Pulmonary/Chest: Effort normal and breath sounds normal. No stridor. No respiratory distress. She has no wheezes. She has no rales. She exhibits no tenderness.  Abdominal: Soft. She exhibits no distension. There is no tenderness.  Genitourinary: Guaiac negative stool. Uterus is enlarged and tender. Cervix exhibits motion tenderness and discharge. Right adnexum displays no mass and no tenderness. Left adnexum displays no mass and no tenderness. No erythema, tenderness or bleeding in the vagina. No signs of injury around the vagina. Vaginal discharge found.  Mild cervical d/c and CMT.  Musculoskeletal: Normal range of motion. She exhibits no edema or tenderness.  Lymphadenopathy:    She has no  cervical adenopathy.  Neurological: She is alert and oriented to person, place, and time.  Skin: Skin is warm and dry. No rash noted. She is not diaphoretic. No erythema. No pallor.  Psychiatric: She has a normal mood and affect. Her behavior is normal. Judgment and thought content normal.  Nursing note and vitals reviewed.   ED Course  Procedures (including critical care time) Labs Review Labs Reviewed  WET PREP, GENITAL - Abnormal; Notable for the following:    Clue Cells Wet Prep HPF POC MANY (*)    WBC, Wet Prep HPF POC TOO NUMEROUS TO COUNT (*)    All other components within normal limits  COMPREHENSIVE METABOLIC PANEL - Abnormal; Notable for the following:    Calcium 8.6 (*)    ALT 11 (*)    All other components within normal limits  CBC - Abnormal; Notable for the following:    RBC 5.36 (*)    Hemoglobin 10.2 (*)    HCT 32.9 (*)    MCV 61.4 (*)    MCH 19.0 (*)    RDW 19.2 (*)    All other components within normal limits  URINALYSIS, ROUTINE W REFLEX MICROSCOPIC (NOT AT [facility]) - Abnormal; Notable for  the following:    APPearance CLOUDY (*)    Bilirubin Urine SMALL (*)    Ketones, ur 15 (*)    Leukocytes, UA MODERATE (*)    All other components within normal limits  URINE MICROSCOPIC-ADD ON - Abnormal; Notable for the following:    Squamous Epithelial / LPF FEW (*)    All other components within normal limits  LIPASE, BLOOD  APTT  PROTIME-INR  POC URINE PREG, ED  POC OCCULT BLOOD, ED  GC/CHLAMYDIA PROBE AMP ([place]) NOT AT [facility]   Prior imaging studies and labs were independently reviewed by me. MDM   Final diagnoses:  Pelvic pain in female  Dysfunctional uterine bleeding  BV (bacterial vaginosis)    Pt with hx of DUB s/p tubal ligation.  Not on OCPs.  These were recommended in the past for pelvic pain and DUB.  Prior US imaging shows signs if increased echogenicity in the uterus and possible leiyomyoma.  No other concerning findings on exam.  Will tx  empirically for PID, due to CMT, but suspect posible adenomyosis, leiomyoma, or endometriosis contributing to Pt pain as she has worsening pain with her menstrual cycles.  Hbg stable.  D/C with ABX, antiemetic and pain control.  Abx in the ED as well.  Patient was given return precautions for pelvic pain, PID, and DUB.  Pt advised on use of medications as applicable.  Advised to return for actely worsening symptoms, inability to take medications, or other acute concerns.  Advised to follow up with OB/GYN as scheduled in December.  Patient was in agreement with and expressed understanding of follow plan, plan of care, and return precautions.  All questions answered prior to discharge.  Patient was discharged in stable condition, ambulating without difficulty. te Patient care was discussed with my attending, Dr. Ashok Cordia.   Hoyle Sauer, MD 06/04/15 [place], MD 06/05/15 1536

## 2015-06-03 NOTE — Discharge Instructions (Signed)

## 2015-06-03 NOTE — ED Notes (Signed)
Pelvic cart at bedside. 

## 2015-06-03 NOTE — ED Notes (Signed)
C/o LLQ pain/ pelvic pain since having surgery to remove cyst on L ovary in January.  States pain worse over the past 2-3 days with frequent urination and frequent bowel movements.  Denies diarrhea.

## 2015-06-03 NOTE — ED Notes (Signed)
Patient verbalized understanding of discharge instructions and prescription medications and denies any further questions at this time.  VS stable. Patient declined wheelchair and this RN walked patient to front of ED entrance.

## 2015-06-04 LAB — GC/CHLAMYDIA PROBE AMP (~~LOC~~) NOT AT ARMC
Chlamydia: NEGATIVE
Neisseria Gonorrhea: NEGATIVE

## 2015-06-29 ENCOUNTER — Ambulatory Visit: Payer: 59 | Admitting: Family Medicine

## 2015-07-05 ENCOUNTER — Emergency Department (HOSPITAL_COMMUNITY): Payer: Commercial Managed Care - HMO

## 2015-07-05 ENCOUNTER — Encounter (HOSPITAL_COMMUNITY): Payer: Self-pay | Admitting: Physical Medicine and Rehabilitation

## 2015-07-05 ENCOUNTER — Emergency Department (HOSPITAL_COMMUNITY)
Admission: EM | Admit: 2015-07-05 | Discharge: 2015-07-05 | Disposition: A | Discharge status: Home / Self Care | Payer: Commercial Managed Care - HMO | Attending: Emergency Medicine | Admitting: Emergency Medicine

## 2015-07-05 DIAGNOSIS — S339XXA Sprain of unspecified parts of lumbar spine and pelvis, initial encounter: Secondary | ICD-10-CM | POA: Diagnosis not present

## 2015-07-05 DIAGNOSIS — I10 Essential (primary) hypertension: Secondary | ICD-10-CM | POA: Diagnosis not present

## 2015-07-05 DIAGNOSIS — Z862 Personal history of diseases of the blood and blood-forming organs and certain disorders involving the immune mechanism: Secondary | ICD-10-CM | POA: Insufficient documentation

## 2015-07-05 DIAGNOSIS — W01198A Fall on same level from slipping, tripping and stumbling with subsequent striking against other object, initial encounter: Secondary | ICD-10-CM | POA: Insufficient documentation

## 2015-07-05 DIAGNOSIS — Y9301 Activity, walking, marching and hiking: Secondary | ICD-10-CM | POA: Insufficient documentation

## 2015-07-05 DIAGNOSIS — S8012XA Contusion of left lower leg, initial encounter: Secondary | ICD-10-CM | POA: Diagnosis not present

## 2015-07-05 DIAGNOSIS — Y92009 Unspecified place in unspecified non-institutional (private) residence as the place of occurrence of the external cause: Secondary | ICD-10-CM | POA: Diagnosis not present

## 2015-07-05 DIAGNOSIS — Z79899 Other long term (current) drug therapy: Secondary | ICD-10-CM | POA: Diagnosis not present

## 2015-07-05 DIAGNOSIS — Y998 Other external cause status: Secondary | ICD-10-CM | POA: Diagnosis not present

## 2015-07-05 DIAGNOSIS — S3992XA Unspecified injury of lower back, initial encounter: Secondary | ICD-10-CM | POA: Diagnosis present

## 2015-07-05 DIAGNOSIS — S338XXA Sprain of other parts of lumbar spine and pelvis, initial encounter: Secondary | ICD-10-CM

## 2015-07-05 DIAGNOSIS — Z8742 Personal history of other diseases of the female genital tract: Secondary | ICD-10-CM | POA: Diagnosis not present

## 2015-07-05 DIAGNOSIS — W19XXXA Unspecified fall, initial encounter: Secondary | ICD-10-CM

## 2015-07-05 MED ORDER — KETOROLAC TROMETHAMINE 60 MG/2ML IM SOLN
60.0000 mg | Freq: Once | INTRAMUSCULAR | Status: AC
  Administered 2015-07-05: 60 mg via INTRAMUSCULAR
  Filled 2015-07-05: qty 2

## 2015-07-05 NOTE — ED Notes (Signed)
Pt states she slipped and fell on ice yesterday at home. Reports landing on back and head. Denies LOC. Now reports pain to coccyx and headache. Ambulatory to triage. Pt is alert and oriented x4. NAD

## 2015-07-05 NOTE — ED Provider Notes (Signed)
History  By signing my name below, I, Marlowe Kays, attest that this documentation has been prepared under the direction and in the presence of [employer], PA-C. Electronically Signed: Marlowe Kays, ED Scribe. 07/05/2015. 8:48 PM.  Chief Complaint  Patient presents with  . Fall   The history is provided by the patient and medical records. No language interpreter was used.    HPI Comments:  Robin Larson is a 34 y.o. obese female who presents to the Emergency Department complaining of a fall that occurred yesterday morning approximately 36 hours ago. She states she was walking down a ramp at home, slipped backwards on ice and landed on her backside and hit the back of her head. She states her left leg went underneath her and now has a bruise to the lower anterior shin. She reports severe pain of her tail bone. She states she was able to get herself up and and has been ambulatory since without issue. She has taken Vicodin with minimal relief of the pain. Touching the area increases the pain. She denies modifying factors. She denies LOC, numbness, tingling or weakness of the lower extremities, nausea or vomiting. She denies any bowel or bladder incontinence or retention, recent prednisone use.  Past Medical History  Diagnosis Date  . Anemia   . Hypertension     no meds since approx 2 yrs ago 2013  . Headache   . Ovarian cyst    Past Surgical History  Procedure Laterality Date  . Cesarean section  2001, 2004    x 2  . Tubal ligation    . Laparotomy Left 08/01/2014    Procedure: LAPAROTOMY/REMOVAL OF LEFT OVARIAN CYSTECTOMY;  Surgeon: Lavonia Drafts, MD;  Location: [facility];  Service: Gynecology;  Laterality: Left;  . Oophorectomy Left 08/01/2014    Procedure: LEFT OOPHORECTOMY;  Surgeon: Lavonia Drafts, MD;  Location: [facility];  Service: Gynecology;  Laterality: Left;  . Ovarian cyst removal     No family history on file. Social History  Substance Use Topics  .  Smoking status: Never Smoker   . Smokeless tobacco: Never Used  . Alcohol Use: No   OB History    Gravida Para Term Preterm AB TAB SAB Ectopic Multiple Living   2 2 1 1     1 3      Review of Systems  Gastrointestinal: Negative for nausea and vomiting.  Musculoskeletal: Positive for back pain.  Skin: Negative for color change and wound.  Neurological: Negative for syncope and numbness.    Allergies  Percocet and Codeine  Home Medications   Prior to Admission medications   Medication Sig Start Date End Date Taking? Authorizing Provider  acetaminophen (TYLENOL) 500 MG tablet Take 1,000-1,500 mg by mouth every 4 (four) hours as needed (pain).   Yes Historical Provider, MD  aspirin-acetaminophen-caffeine (EXCEDRIN MIGRAINE) [phone] MG per tablet Take 2 tablets by mouth every 6 (six) hours as needed for headache or migraine.    Yes Historical Provider, MD  HYDROcodone-acetaminophen (NORCO/VICODIN) 5-325 MG tablet Take 1 tablet by mouth every 4 (four) hours as needed for severe pain. 06/03/15  Yes Hoyle Sauer, MD  norgestimate-ethinyl estradiol (ORTHO-CYCLEN,SPRINTEC,PREVIFEM) 0.25-35 MG-MCG tablet Take 1 tablet by mouth daily. 06/03/15  Yes Hoyle Sauer, MD  doxycycline (VIBRAMYCIN) 100 MG capsule Take 1 capsule (100 mg total) by mouth 2 (two) times daily. Patient not taking: Reported on 07/05/2015 06/03/15   Hoyle Sauer, MD  hydrOXYzine (ATARAX/VISTARIL) 25 MG tablet Take 1 tablet (25 mg total)  by mouth every 6 (six) hours as needed for itching. Patient not taking: Reported on 06/03/2015 08/31/14   Lavonia Drafts, MD  ibuprofen (ADVIL,MOTRIN) 400 MG tablet Take 1 tablet (400 mg total) by mouth every 6 (six) hours as needed for mild pain, moderate pain or cramping. Patient not taking: Reported on 07/05/2015 06/03/15   Hoyle Sauer, MD  metroNIDAZOLE (FLAGYL) 500 MG tablet Take 1 tablet (500 mg total) by mouth 2 (two) times daily. Patient not taking: Reported on 07/05/2015  06/03/15   Hoyle Sauer, MD  ondansetron (ZOFRAN) 4 MG tablet Take 1 tablet (4 mg total) by mouth every 6 (six) hours. Patient not taking: Reported on 07/05/2015 06/03/15   Hoyle Sauer, MD   Triage Vitals: BP 120/90 mmHg  Pulse 79  Temp(Src) 99.3 F (37.4 C) (Oral)  Resp 20  SpO2 99% Physical Exam  Constitutional: She is oriented to person, place, and time. She appears well-developed and well-nourished.  HENT:  Head: Normocephalic and atraumatic.  Eyes: EOM are normal.  Neck: Normal range of motion.  Cardiovascular: Normal rate.   Pulmonary/Chest: Effort normal.  Musculoskeletal: Normal range of motion. She exhibits tenderness.  Pain at top part of gluteal fold. No ecchymosis or erythema. Ambulatory with steady gait. Painful to sit.  Neurological: She is alert and oriented to person, place, and time.  Skin: Skin is warm and dry.  Psychiatric: She has a normal mood and affect. Her behavior is normal.  Nursing note and vitals reviewed.   ED Course  Procedures (including critical care time) DIAGNOSTIC STUDIES: Oxygen Saturation is 99% on RA, normal by my interpretation.   COORDINATION OF CARE: 8:02 PM- Will X-Ray coccyx and order Toradol injection. Pt verbalizes understanding and agrees to plan.  Medications  ketorolac (TORADOL) injection 60 mg (60 mg Intramuscular Given 07/05/15 2025)    Labs Review Labs Reviewed - No data to display  Imaging Review Dg Sacrum/coccyx  07/05/2015  CLINICAL DATA:  Fall onto buttocks yesterday. Sacrococcygeal pain. Initial encounter. EXAM: SACRUM AND COCCYX - 2+ VIEW COMPARISON:  None. FINDINGS: There is no evidence of fracture or other focal bone lesions. IMPRESSION: Negative. Electronically Signed   By: Earle Gell M.D.   On: 07/05/2015 20:25   I have personally reviewed and evaluated these images results as part of my medical decision-making.   EKG Interpretation None      MDM   Final diagnoses:  Fall, initial encounter  Sacrum  sprain, initial encounter   Patient with coccxy and sacrum pain after slip and fall.  X-ray of the sacrum and coccyx is negative for fracture. No neurological deficits and normal neuro exam.  Patient is ambulatory.  No loss of bowel or bladder control.  No concern for cauda equina.  No fever, night sweats, weight loss. No urinary symptoms suggestive of UTI.  Supportive care and return precautions discussed. She stated that she would take Tylenol or ibuprofen at home for pain and that she did not need anything. Appears safe for discharge at this time. Follow up as indicated in discharge paperwork.    I personally performed the services described in this documentation, which was scribed in my presence. The recorded information has been reviewed and is accurate.     Ottie Glazier, PA-C 07/05/15 2104  Wandra Arthurs, MD 07/06/15 [phone]

## 2015-07-05 NOTE — Discharge Instructions (Signed)
Tailbone Injury The tailbone is the small bone at the lower end of the backbone (spine). You may have stretched tissues, bruises, or a broken bone (fracture). These injuries can be painful. Most tailbone injuries get better on their own in 4-6 weeks. HOME CARE  Take medicines only as told by your doctor.  If told, apply ice to the injured area.  Put ice in a plastic bag.  Place a towel between your skin and the bag.  Leave the ice on for 20 minutes, 2-3 times per day. Do this for the first 1-2 days.  Sit on a large, rubber or inflated ring or cushion to lessen pain. Lean forward when you sit to help lessen pain.  Avoid sitting in one place for a long time.  Increase your activity as the pain allows.  Do exercises as told by your doctor or physical therapist.  If it is painful to poop, take medicine to help you poop (stool softeners) as told by your doctor.  Eat foods that have plenty of fiber.  Keep all follow-up visits as told by your doctor. This is important. GET HELP IF:  Your pain gets worse.  Pooping causes you pain.  You cannot poop (constipation).  You are leaking pee (urinary incontinence).  You have a fever.   This information is not intended to replace advice given to you by your health care provider. Make sure you discuss any questions you have with your health care provider.   Document Released: 08/16/2010 Document Revised: 11/28/2014 Document Reviewed: 07/10/2014 Elsevier Interactive Patient Education 2016 [employer]  

## 2015-11-08 ENCOUNTER — Encounter (INDEPENDENT_AMBULATORY_CARE_PROVIDER_SITE_OTHER): Payer: Self-pay

## 2016-05-21 ENCOUNTER — Emergency Department (HOSPITAL_COMMUNITY)
Admission: EM | Admit: 2016-05-21 | Discharge: 2016-05-21 | Disposition: A | Discharge status: Home / Self Care | Payer: 59 | Attending: Emergency Medicine | Admitting: Emergency Medicine

## 2016-05-21 ENCOUNTER — Encounter (HOSPITAL_COMMUNITY): Payer: Self-pay

## 2016-05-21 DIAGNOSIS — I10 Essential (primary) hypertension: Secondary | ICD-10-CM | POA: Insufficient documentation

## 2016-05-21 DIAGNOSIS — B001 Herpesviral vesicular dermatitis: Secondary | ICD-10-CM

## 2016-05-21 DIAGNOSIS — Z79899 Other long term (current) drug therapy: Secondary | ICD-10-CM | POA: Insufficient documentation

## 2016-05-21 DIAGNOSIS — B009 Herpesviral infection, unspecified: Secondary | ICD-10-CM | POA: Insufficient documentation

## 2016-05-21 DIAGNOSIS — Z7982 Long term (current) use of aspirin: Secondary | ICD-10-CM | POA: Insufficient documentation

## 2016-05-21 NOTE — ED Notes (Signed)
See provider notes for assessment

## 2016-05-21 NOTE — Discharge Instructions (Signed)
Use OTC Abreva as prescribed on the packaging on your cold sore. Follow-up with your primary care provider/OB/GYN this week to discuss medications for cold sores and to discuss your symptoms that you feel might be similar to your previous left ovarian cyst. Return immediately to the emergency department if you experience sores in your mouth, swelling of your throat, sore throat, fever, abdominal pain, nausea, vomiting or any other concerning symptoms.

## 2016-05-21 NOTE — ED Triage Notes (Signed)
Pt has possible bug bite to upper lip. Lip appears swollen. Pt reports she tried to treat the blister with blistex. No airway involvement or SOB.

## 2016-05-21 NOTE — ED Provider Notes (Signed)
[facility] DEPT Provider Note   CSN: [id number] Arrival date & time: 05/21/16  1648  By signing my name below, I, Dora Sims, attest that this documentation has been prepared under the direction and in the presence of Jackson Latino, PA-C. Electronically Signed: Dora Sims, Scribe. 05/21/2016. 5:06 PM.  History   Chief Complaint Chief Complaint  Patient presents with  . Insect Bite    The history is provided by the patient. No language interpreter was used.     HPI Comments: Robin Larson is a 35 y.o. female who presents to the Emergency Department with concern for insect bite sustained to her upper lip beginning yesterday morning. Pt states she started experiencing right-sided upper lip swelling and itching when she woke up yesterday morning. She purchased Blistex and applied it throughout the day, but states her upper lip became more swollen, pruritic, and also started tingling. Pt also notes the area is painful and endorses pain exacerbation with palpation to the right side of her upper lip. She notes she did not see or feel an insect bite her. She denies recent oral sex, recent illness, unusual sun exposure, or h/o similar symptoms. Pt further denies fever, chills, SOB, sore throat, or any other associated symptoms.  Pt also notes h/o left ovarian cyst removal. She states she is starting to experience symptoms similar to when she had her ovarian cyst and feels like "it is coming back."  Past Medical History:  Diagnosis Date  . Anemia   . Headache   . Hypertension    no meds since approx 2 yrs ago 2013  . Ovarian cyst     Patient Active Problem List   Diagnosis Date Noted  . Left ovarian cyst 08/01/2014  . 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 CESAREAN SECTION  2001, 2004   x 2  . LAPAROTOMY Left 08/01/2014   Procedure: LAPAROTOMY/REMOVAL OF LEFT OVARIAN CYSTECTOMY;  Surgeon: Lavonia Drafts, MD;  Location:  [facility];  Service: Gynecology;  Laterality: Left;  . OOPHORECTOMY Left 08/01/2014   Procedure: LEFT OOPHORECTOMY;  Surgeon: Lavonia Drafts, MD;  Location: [facility];  Service: Gynecology;  Laterality: Left;  . OVARIAN CYST REMOVAL    . TUBAL LIGATION      OB History    Gravida Para Term Preterm AB Living   2 2 1 1   3    SAB TAB Ectopic Multiple Live Births         1         Home Medications    Prior to Admission medications   Medication Sig Start Date End Date Taking? Authorizing Provider  acetaminophen (TYLENOL) 500 MG tablet Take 1,000-1,500 mg by mouth every 4 (four) hours as needed (pain).    Historical Provider, MD  aspirin-acetaminophen-caffeine (EXCEDRIN MIGRAINE) [phone] MG per tablet Take 2 tablets by mouth every 6 (six) hours as needed for headache or migraine.     Historical Provider, MD  doxycycline (VIBRAMYCIN) 100 MG capsule Take 1 capsule (100 mg total) by mouth 2 (two) times daily. Patient not taking: Reported on 07/05/2015 06/03/15   Hoyle Sauer, MD  HYDROcodone-acetaminophen (NORCO/VICODIN) 5-325 MG tablet Take 1 tablet by mouth every 4 (four) hours as needed for severe pain. 06/03/15   Hoyle Sauer, MD  hydrOXYzine (ATARAX/VISTARIL) 25 MG tablet Take 1 tablet (25 mg total) by mouth every 6 (six) hours as needed for itching. Patient not taking: Reported on 06/03/2015 08/31/14   Hoyle Sauer  Harraway-Smith, MD  ibuprofen (ADVIL,MOTRIN) 400 MG tablet Take 1 tablet (400 mg total) by mouth every 6 (six) hours as needed for mild pain, moderate pain or cramping. Patient not taking: Reported on 07/05/2015 06/03/15   Hoyle Sauer, MD  metroNIDAZOLE (FLAGYL) 500 MG tablet Take 1 tablet (500 mg total) by mouth 2 (two) times daily. Patient not taking: Reported on 07/05/2015 06/03/15   Hoyle Sauer, MD  norgestimate-ethinyl estradiol (ORTHO-CYCLEN,SPRINTEC,PREVIFEM) 0.25-35 MG-MCG tablet Take 1 tablet by mouth daily. 06/03/15   Hoyle Sauer, MD  ondansetron (ZOFRAN) 4 MG tablet  Take 1 tablet (4 mg total) by mouth every 6 (six) hours. Patient not taking: Reported on 07/05/2015 06/03/15   Hoyle Sauer, MD    Family History History reviewed. No pertinent family history.  Social History Social History  Substance Use Topics  . Smoking status: Never Smoker  . Smokeless tobacco: Never Used  . Alcohol use No     Allergies   Percocet [oxycodone-acetaminophen] and Codeine   Review of Systems Review of Systems  Constitutional: Negative for chills and fever.  HENT: Positive for mouth sores (upper lip). Negative for sore throat.   Respiratory: Negative for shortness of breath.   Neurological: Positive for numbness (upper lip tingling).    Physical Exam Updated Vital Signs BP 139/72 (BP Location: Left Arm)   Pulse 78   Temp 98.9 F (37.2 C) (Oral)   Resp 18   SpO2 100%   Physical Exam  Constitutional: She appears well-developed and well-nourished. No distress.  HENT:  Head: Normocephalic and atraumatic.  Mouth/Throat: Oropharynx is clear and moist and mucous membranes are normal.  Right upper lip swollen with small ulcer noted. No signs of surrounding infection, no erythema, fluctuance, red streaks.  Eyes: Conjunctivae are normal.  Pulmonary/Chest: Effort normal. No respiratory distress.  Musculoskeletal: Normal range of motion.  Neurological: She is alert. Coordination normal.  Skin: Skin is warm and dry. She is not diaphoretic.  Psychiatric: She has a normal mood and affect. Her behavior is normal.  Nursing note and vitals reviewed.   ED Treatments / Results  Labs (all labs ordered are listed, but only abnormal results are displayed) Labs Reviewed - No data to display  EKG  EKG Interpretation None       Radiology No results found.  Procedures Procedures (including critical care time)  DIAGNOSTIC STUDIES: Oxygen Saturation is 100% on RA, normal by my interpretation.    COORDINATION OF CARE: 5:15 PM Discussed treatment plan with  pt at bedside and pt agreed to plan.  Medications Ordered in ED Medications - No data to display   Initial Impression / Assessment and Plan / ED Course  I have reviewed the triage vital signs and the nursing notes.  Pertinent labs & imaging results that were available during my care of the patient were reviewed by me and considered in my medical decision making (see chart for details).  Clinical Course   Patient with exam concerning for cold sore. Patient states she's never had one before. Patient denies fever, she is afebrile, VSS, no acute distress. Instructed patient to use over-the-counter Abreva and follow-up with her PCP. Patient states she feels as though her ovarian cyst might be returning. I instructed patient to call her OB/GYN first thing in the morning to schedule an appointment to be seen this week. Patient denies nausea, vomiting. Discussed strict return precautions to the ED. Pt expressed understanding to the d/c instructions.   I personally performed the services described in  this documentation, which was scribed in my presence. The recorded information has been reviewed and is accurate.   Final Clinical Impressions(s) / ED Diagnoses   Final diagnoses:  Cold sore    New Prescriptions New Prescriptions   No medications on file     Kalman Drape, [place] 05/21/16 [place], MD 05/21/16 2326

## 2017-05-12 ENCOUNTER — Encounter (HOSPITAL_COMMUNITY): Payer: Self-pay | Admitting: *Deleted

## 2017-05-12 ENCOUNTER — Emergency Department (HOSPITAL_COMMUNITY): Payer: Self-pay

## 2017-05-12 ENCOUNTER — Emergency Department (HOSPITAL_COMMUNITY)
Admission: EM | Admit: 2017-05-12 | Discharge: 2017-05-13 | Disposition: A | Discharge status: Home / Self Care | Payer: Self-pay | Attending: Emergency Medicine | Admitting: Emergency Medicine

## 2017-05-12 DIAGNOSIS — D509 Iron deficiency anemia, unspecified: Secondary | ICD-10-CM | POA: Insufficient documentation

## 2017-05-12 DIAGNOSIS — R102 Pelvic and perineal pain: Secondary | ICD-10-CM | POA: Insufficient documentation

## 2017-05-12 DIAGNOSIS — R1032 Left lower quadrant pain: Secondary | ICD-10-CM | POA: Insufficient documentation

## 2017-05-12 DIAGNOSIS — I10 Essential (primary) hypertension: Secondary | ICD-10-CM | POA: Insufficient documentation

## 2017-05-12 DIAGNOSIS — R109 Unspecified abdominal pain: Secondary | ICD-10-CM

## 2017-05-12 DIAGNOSIS — N760 Acute vaginitis: Secondary | ICD-10-CM | POA: Insufficient documentation

## 2017-05-12 DIAGNOSIS — Z79899 Other long term (current) drug therapy: Secondary | ICD-10-CM | POA: Insufficient documentation

## 2017-05-12 DIAGNOSIS — B9689 Other specified bacterial agents as the cause of diseases classified elsewhere: Secondary | ICD-10-CM | POA: Insufficient documentation

## 2017-05-12 LAB — COMPREHENSIVE METABOLIC PANEL
ALT: 10 U/L — ABNORMAL LOW (ref 14–54)
AST: 14 U/L — ABNORMAL LOW (ref 15–41)
Albumin: 3.9 g/dL (ref 3.5–5.0)
Alkaline Phosphatase: 52 U/L (ref 38–126)
Anion gap: 8 (ref 5–15)
BUN: 11 mg/dL (ref 6–20)
CO2: 24 mmol/L (ref 22–32)
Calcium: 9 mg/dL (ref 8.9–10.3)
Chloride: 106 mmol/L (ref 101–111)
Creatinine, Ser: 0.85 mg/dL (ref 0.44–1.00)
GFR calc Af Amer: >60 mL/min (ref >60)
GFR calc non Af Amer: >60 mL/min (ref >60)
Glucose, Bld: 97 mg/dL (ref 65–99)
Potassium: 3.7 mmol/L (ref 3.5–5.1)
Sodium: 138 mmol/L (ref 135–145)
Total Bilirubin: 0.3 mg/dL (ref 0.3–1.2)
Total Protein: 6.9 g/dL (ref 6.5–8.1)

## 2017-05-12 LAB — CBC
HCT: 32.8 % — ABNORMAL LOW (ref 36.0–46.0)
Hemoglobin: 10.4 g/dL — ABNORMAL LOW (ref 12.0–15.0)
MCH: 18.8 pg — ABNORMAL LOW (ref 26.0–34.0)
MCHC: 31.7 g/dL (ref 30.0–36.0)
MCV: 59.2 fL — ABNORMAL LOW (ref 78.0–100.0)
Platelets: 216 10*3/uL (ref 150–400)
RBC: 5.54 MIL/uL — ABNORMAL HIGH (ref 3.87–5.11)
RDW: 19.4 % — ABNORMAL HIGH (ref 11.5–15.5)
WBC: 6.8 10*3/uL (ref 4.0–10.5)

## 2017-05-12 LAB — URINALYSIS, ROUTINE W REFLEX MICROSCOPIC
Bilirubin Urine: NEGATIVE
Glucose, UA: NEGATIVE mg/dL
Hgb urine dipstick: NEGATIVE
Ketones, ur: NEGATIVE mg/dL
Nitrite: NEGATIVE
Protein, ur: NEGATIVE mg/dL
Specific Gravity, Urine: 1.024 (ref 1.005–1.030)
pH: 5 (ref 5.0–8.0)

## 2017-05-12 LAB — WET PREP, GENITAL
Sperm: NONE SEEN
TRICH WET PREP: NONE SEEN
YEAST WET PREP: NONE SEEN

## 2017-05-12 LAB — LIPASE, BLOOD: Lipase: 31 U/L (ref 11–51)

## 2017-05-12 MED ORDER — METRONIDAZOLE 500 MG PO TABS
500.0000 mg | ORAL_TABLET | Freq: Two times a day (BID) | ORAL | 0 refills | Status: AC
Start: 2017-05-12 — End: 2017-05-19

## 2017-05-12 MED ORDER — IBUPROFEN 800 MG PO TABS
800.0000 mg | ORAL_TABLET | Freq: Once | ORAL | Status: AC
  Administered 2017-05-12: 800 mg via ORAL
  Filled 2017-05-12: qty 1

## 2017-05-12 MED ORDER — FERROUS SULFATE 325 (65 FE) MG PO TABS: 325.0000 mg | ORAL_TABLET | Freq: Every day | ORAL | 2 refills | Status: DC

## 2017-05-12 MED ORDER — IBUPROFEN 800 MG PO TABS: 800.0000 mg | ORAL_TABLET | Freq: Once | ORAL | Status: DC

## 2017-05-12 NOTE — ED Notes (Signed)
Patient transported to Ultrasound 

## 2017-05-12 NOTE — ED Triage Notes (Signed)
To ED for eval of llq pain for past couple of days. Feels similar to when she was dx with cyst on same ovary- had to have surgically removed. No trouble with urination, no diarrhea, no vomiting.

## 2017-05-12 NOTE — Discharge Instructions (Addendum)
Please take iron supplements as prescribed for your anemia and use Miralax daily to prevent constipation with this medication.  Take Flagyl for one week for bacterial vaginosis; do not drink alcohol when using this medication.   Your labs, urine, ultrasound and CT scan showed no acute abnormality today. You have chronic anemia with a hemoglobin of 10 which is stable for you. We recommended you start iron tablets for this. Please follow-up closely with a primary care physician.   To find a primary care or specialty doctor please call [phone] or [phone] to access "[facility] a Doctor Service."  You may also go on the [facility] website at [email or web address]  There are also multiple [facility], [facility], [facility] and [facility] practices throughout the [place] that are frequently accepting new patients. You may find a clinic that is close to your home and contact them.  [place] [id number] [place]  [place] [address] [address] [place] [phone]

## 2017-05-12 NOTE — ED Provider Notes (Signed)
[facility] EMERGENCY DEPARTMENT Provider Note   CSN: [id number] Arrival date & time: 05/12/17  1740  History   Chief Complaint Chief Complaint  Patient presents with  . Abdominal Pain   HPI Crystol Walpole is a 36 y.o. female.  The patient is a 36yo female with a medical history significant for ovarian cyst s/p oophorectomy, tubal ligation, iron deficiency anemia, and hypertension, who presents to the ED complaining of abdominal pain.  Her pain is sharp and located in the left lower abdomen. The pain began 5 days ago. She then slipped and fell 2 days ago, at which time she reports the pain became significantly worse.   The pain is worse with pressure and twisting; holding still makes it better.  She states this pain feels exactly like the pain she had when diagnosed with an ovarian cyst.  She reports clear vaginal discharge when she is not menstruating.  This has been happening intermittently for the past several months. The patient is sexually active however she denies any new sexual partners.  No nausea, vomiting, fevers, or diarrhea.  She stopped taking her iron pills due to constipation.   The history is provided by the patient and medical records. No language interpreter was used.   Past Medical History:  Diagnosis Date  . Anemia   . Headache   . Hypertension    no meds since approx 2 yrs ago 2013  . Ovarian cyst    Patient Active Problem List   Diagnosis Date Noted  . Left ovarian cyst 08/01/2014  . 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 CESAREAN SECTION  2001, 2004   x 2  . LAPAROTOMY Left 08/01/2014   Procedure: LAPAROTOMY/REMOVAL OF LEFT OVARIAN CYSTECTOMY;  Surgeon: Lavonia Drafts, MD;  Location: [facility];  Service: Gynecology;  Laterality: Left;  . OOPHORECTOMY Left 08/01/2014   Procedure: LEFT OOPHORECTOMY;  Surgeon: Lavonia Drafts, MD;  Location: [facility];  Service: Gynecology;  Laterality:  Left;  . OVARIAN CYST REMOVAL    . TUBAL LIGATION     OB History    Gravida Para Term Preterm AB Living   2 2 1 1   3    SAB TAB Ectopic Multiple Live Births         1        Home Medications    Prior to Admission medications   Medication Sig Start Date End Date Taking? Authorizing Provider  acetaminophen (TYLENOL) 500 MG tablet Take 1,000-1,500 mg by mouth every 4 (four) hours as needed (pain).   Yes [provider]  aspirin-acetaminophen-caffeine (EXCEDRIN MIGRAINE) [phone] MG per tablet Take 2 tablets by mouth every 6 (six) hours as needed for headache or migraine.    Yes [provider]  naproxen sodium (ANAPROX) 220 MG tablet Take 440 mg by mouth as needed (pain).   Yes [provider]  doxycycline (VIBRAMYCIN) 100 MG capsule Take 1 capsule (100 mg total) by mouth 2 (two) times daily. Patient not taking: Reported on 05/12/2017 06/03/15   Hoyle Sauer, MD  ferrous sulfate 325 (65 FE) MG tablet Take 1 tablet (325 mg total) by mouth daily. 05/12/17   Charisse March, MD  HYDROcodone-acetaminophen (NORCO/VICODIN) 5-325 MG tablet Take 1 tablet by mouth every 4 (four) hours as needed for severe pain. Patient not taking: Reported on 05/12/2017 06/03/15   Hoyle Sauer, MD  hydrOXYzine (ATARAX/VISTARIL) 25 MG tablet Take 1 tablet (25 mg total) by mouth  every 6 (six) hours as needed for itching. Patient not taking: Reported on 05/12/2017 08/31/14   Lavonia Drafts, MD  ibuprofen (ADVIL,MOTRIN) 400 MG tablet Take 1 tablet (400 mg total) by mouth every 6 (six) hours as needed for mild pain, moderate pain or cramping. Patient not taking: Reported on 05/12/2017 06/03/15   Hoyle Sauer, MD  metroNIDAZOLE (FLAGYL) 500 MG tablet Take 1 tablet (500 mg total) by mouth 2 (two) times daily. 05/12/17 05/19/17  Charisse March, MD  norgestimate-ethinyl estradiol (ORTHO-CYCLEN,SPRINTEC,PREVIFEM) 0.25-35 MG-MCG tablet Take 1 tablet by mouth daily. Patient not taking:  Reported on 05/12/2017 06/03/15   Hoyle Sauer, MD  ondansetron (ZOFRAN) 4 MG tablet Take 1 tablet (4 mg total) by mouth every 6 (six) hours. Patient not taking: Reported on 05/12/2017 06/03/15   Hoyle Sauer, MD    Family History No family history on file.  Social History Social History  Substance Use Topics  . Smoking status: Never Smoker  . Smokeless tobacco: Never Used  . Alcohol use No   Allergies   Percocet [oxycodone-acetaminophen] and Codeine  Review of Systems Review of Systems  Constitutional: Negative for chills and fever.  Eyes: Negative for visual disturbance.  Respiratory: Negative for cough and shortness of breath.   Cardiovascular: Negative for chest pain and leg swelling.  Gastrointestinal: Positive for abdominal pain. Negative for diarrhea, nausea and vomiting.  Genitourinary: Positive for vaginal discharge. Negative for dysuria and hematuria.  Musculoskeletal: Negative for arthralgias and back pain.  Skin: Negative for color change and rash.  Neurological: Negative for seizures and syncope.  All other systems reviewed and are negative.   Physical Exam Updated Vital Signs BP 110/74   Pulse 71   Temp 98.2 F (36.8 C) (Oral)   Resp 16   LMP 04/12/2017   SpO2 98%   Physical Exam  Constitutional: She is oriented to person, place, and time. She appears well-developed and well-nourished. No distress.  HENT:  Head: Normocephalic and atraumatic.  Mouth/Throat: Oropharynx is clear and moist.  Eyes: Conjunctivae and EOM are normal.  Neck: Neck supple.  Cardiovascular: Normal rate, regular rhythm, normal heart sounds and intact distal pulses.   No murmur heard. Pulmonary/Chest: Effort normal and breath sounds normal. No respiratory distress.  Abdominal: Soft. She exhibits no mass. There is tenderness (left side, LUQ, and LLQ). There is no guarding.  Genitourinary: Vaginal discharge (copious thick, grey discharge) found.  Genitourinary Comments: Left  CVA ttp  Normal external female genitalia; normal vaginal vault with discharge present; left adnexal tenderness; no CMT  Musculoskeletal: She exhibits no edema.  Neurological: She is alert and oriented to person, place, and time.  Skin: Skin is warm and dry.  Psychiatric: She has a normal mood and affect. Her behavior is normal. Judgment and thought content normal.  Nursing note and vitals reviewed.  ED Treatments / Results  Labs (all labs ordered are listed, but only abnormal results are displayed) Labs Reviewed  WET PREP, GENITAL - Abnormal; Notable for the following:       Result Value   Clue Cells Wet Prep HPF POC PRESENT (*)    WBC, Wet Prep HPF POC MANY (*)    All other components within normal limits  COMPREHENSIVE METABOLIC PANEL - Abnormal; Notable for the following:    AST 14 (*)    ALT 10 (*)    All other components within normal limits  CBC - Abnormal; Notable for the following:    RBC 5.54 (*)    Hemoglobin  10.4 (*)    HCT 32.8 (*)    MCV 59.2 (*)    MCH 18.8 (*)    RDW 19.4 (*)    All other components within normal limits  URINALYSIS, ROUTINE W REFLEX MICROSCOPIC - Abnormal; Notable for the following:    APPearance HAZY (*)    Leukocytes, UA SMALL (*)    Bacteria, UA RARE (*)    Squamous Epithelial / LPF 6-30 (*)    All other components within normal limits  LIPASE, BLOOD  HCG, SERUM, QUALITATIVE  GC/CHLAMYDIA PROBE AMP ([facility]) NOT AT [facility]   EKG  EKG Interpretation None      Radiology US Pelvic Complete W Transvaginal And Torsion R/o  Result Date: 05/13/2017 CLINICAL DATA:  36 y/o F; pelvic pain. History of tubal ligation, left oophorectomy, C-section. EXAM: TRANSABDOMINAL AND TRANSVAGINAL ULTRASOUND OF PELVIS DOPPLER ULTRASOUND OF OVARIES TECHNIQUE: Both transabdominal and transvaginal ultrasound examinations of the pelvis were performed. Transabdominal technique was performed for global imaging of the pelvis including uterus, ovaries, adnexal  regions, and pelvic cul-de-sac. It was necessary to proceed with endovaginal exam following the transabdominal exam to visualize the uterus and adnexal structures. Color and duplex Doppler ultrasound was utilized to evaluate blood flow to the ovaries. COMPARISON:  06/19/2014 pelvic ultrasound. FINDINGS: Uterus Measurements: 10.9 x 4.8 x 6.3 cm anteverted. 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 Endometrium Thickness: 4 mm.  No focal abnormality visualized. Right ovary Measurements: 4.0 x 2.1 x 2.8 cm. Normal appearance/no adnexal mass. Left ovary Left oophorectomy.  No left adnexal mass identified. Pulsed Doppler evaluation of both ovaries demonstrates normal low-resistance arterial and venous waveforms. Other findings No abnormal free fluid. IMPRESSION: 1. No acute process identified. 2. Left oophorectomy. 3. Stable small uterine myoma. Electronically Signed   By: Kristine Garbe M.D.   On: 05/13/2017 00:05    Procedures Procedures (including critical care time)  Medications Ordered in ED Medications  ibuprofen (ADVIL,MOTRIN) tablet 800 mg (800 mg Oral Given 05/12/17 2149)  acetaminophen (TYLENOL) tablet 1,000 mg (1,000 mg Oral Given 05/13/17 0026)    Initial Impression / Assessment and Plan / ED Course  I have reviewed the triage vital signs and the nursing notes.  Pertinent labs & imaging results that were available during my care of the patient were reviewed by me and considered in my medical decision making (see chart for details).   Initial differential diagnosis included ovarian cyst, TOA, ovarian torsion, pancreatitis, and constipation.  Pertinent labs included microcytic anemia with an MCV 59. CMP unremarkable.  Clue cells and WBCs noted on wet prep. GC/CT pending.CBC without leukocytosis, anemia, or an abnormal platelet count.  The patient was given Ibuprofen for pain with moderate relief upon reassessment.  She was then given Tylenol for her symptoms.   Imaging studies included a pelvic ultrasound notable for a left oophorectomy.  Small leiomyoma noted, however no acute intra-pelvic processes identified.  Non-contrast CT performed to evaluate for a kidney stone given her persistent pain, which is pending at this time  The patient was handed off to Dr. Cyril Mourning Ward, DO, at [facility] with a plan to follow-up on the patient's CT scan.  Prescriptions have been provided for ferrous sulfate and Metronidazole for BV.  The patient was in stable condition at the time of sign-out.  Final Clinical Impressions(s) / ED Diagnoses   Final diagnoses:  Microcytic anemia  Pelvic pain  BV (bacterial vaginosis)  Acute left flank pain  Left  lower quadrant pain   New Prescriptions New Prescriptions   FERROUS SULFATE 325 (65 FE) MG TABLET    Take 1 tablet (325 mg total) by mouth daily.     Charisse March, MD 76/72/09 [id number]    Delora Fuel, MD 62/83/66 [phone]

## 2017-05-13 ENCOUNTER — Emergency Department (HOSPITAL_COMMUNITY): Payer: Self-pay

## 2017-05-13 LAB — GC/CHLAMYDIA PROBE AMP (~~LOC~~) NOT AT ARMC
Chlamydia: NEGATIVE
Neisseria Gonorrhea: NEGATIVE

## 2017-05-13 LAB — PREGNANCY, URINE: Preg Test, Ur: NEGATIVE

## 2017-05-13 MED ORDER — ACETAMINOPHEN 500 MG PO TABS
1000.0000 mg | ORAL_TABLET | Freq: Once | ORAL | Status: AC
  Administered 2017-05-13: 1000 mg via ORAL
  Filled 2017-05-13: qty 2

## 2017-05-13 NOTE — ED Notes (Signed)
Patient transported to CT 

## 2017-05-13 NOTE — ED Provider Notes (Signed)
1:10 AM  Assumed care from Dr. Kenton Kingfisher and Dr. Roxanne Mins.  Patient is a 36 y.o. female with previous history ofleft oophorectomy secondary to ovarian cyst who presents emergency department with left-sided abdominal pain. Labs show chronic anemia. Wet prep positive for clue cells. No UTI. Transvaginal ultrasound unremarkable.Plan is for CT of abdomen and pelvis for further disposition.  3:55 AM  Pt's CT scan shows no acute abnormality. We'll give outpatient PCP follow-up. Recommended alternating Tylenol and Motrin as needed for pain. We'll discharge with iron tablets for her anemia and Flagyl for her bacterial vaginosis. Discussed return precautions. Patient is very well-appearing and has no complaints at this time.   At this time, I do not feel there is any life-threatening condition present. I have reviewed and discussed all results (EKG, imaging, lab, urine as appropriate) and exam findings with patient/family. I have reviewed nursing notes and appropriate previous records.  I feel the patient is safe to be discharged home without further emergent workup and can continue workup as an outpatient as needed. Discussed usual and customary return precautions. Patient/family verbalize understanding and are comfortable with this plan.  Outpatient follow-up has been provided if needed. All questions have been answered.    Robin Larson, Delice Bison, DO 05/13/17 [phone]

## 2017-11-29 ENCOUNTER — Emergency Department (HOSPITAL_COMMUNITY)
Admission: EM | Admit: 2017-11-29 | Discharge: 2017-11-29 | Disposition: A | Discharge status: Home / Self Care | Payer: 59 | Attending: Emergency Medicine | Admitting: Emergency Medicine

## 2017-11-29 ENCOUNTER — Encounter (HOSPITAL_COMMUNITY): Payer: Self-pay | Admitting: Emergency Medicine

## 2017-11-29 ENCOUNTER — Other Ambulatory Visit: Payer: Self-pay

## 2017-11-29 ENCOUNTER — Emergency Department (HOSPITAL_COMMUNITY): Payer: 59

## 2017-11-29 DIAGNOSIS — M79605 Pain in left leg: Secondary | ICD-10-CM

## 2017-11-29 DIAGNOSIS — S8012XA Contusion of left lower leg, initial encounter: Secondary | ICD-10-CM

## 2017-11-29 DIAGNOSIS — I10 Essential (primary) hypertension: Secondary | ICD-10-CM | POA: Insufficient documentation

## 2017-11-29 DIAGNOSIS — Z79899 Other long term (current) drug therapy: Secondary | ICD-10-CM | POA: Insufficient documentation

## 2017-11-29 MED ORDER — IBUPROFEN 800 MG PO TABS: 800.0000 mg | ORAL_TABLET | Freq: Three times a day (TID) | ORAL | 0 refills | Status: DC | PRN

## 2017-11-29 NOTE — ED Provider Notes (Signed)
[facility] EMERGENCY DEPARTMENT Provider Note   CSN: [id number] Arrival date & time: 11/29/17  1512     History   Chief Complaint Chief Complaint  Patient presents with  . Leg Pain    HPI Robin Larson is a 37 y.o. female.  HPI Patient presents to the emergency department with a left lower leg injury after she fell while stepping into a hole in a parking lot 10 days ago.  The patient states that she was walking through the parking lot when she stepped in a pothole and her leg twisted and she fell onto her leg.  Patient states that she has bruising and pain to the left lower leg.  Patient states she did not take any medications prior to arrival.  Patient states he did apply ice to the area as well.  Patient denies any other injuries.  She denies any numbness or weakness in the leg. Past Medical History:  Diagnosis Date  . Anemia   . Headache   . Hypertension    no meds since approx 2 yrs ago 2013  . Ovarian cyst     Patient Active Problem List   Diagnosis Date Noted  . Left ovarian cyst 08/01/2014  . 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 CESAREAN SECTION  2001, 2004   x 2  . LAPAROTOMY Left 08/01/2014   Procedure: LAPAROTOMY/REMOVAL OF LEFT OVARIAN CYSTECTOMY;  Surgeon: Lavonia Drafts, MD;  Location: [facility];  Service: Gynecology;  Laterality: Left;  . OOPHORECTOMY Left 08/01/2014   Procedure: LEFT OOPHORECTOMY;  Surgeon: Lavonia Drafts, MD;  Location: [facility];  Service: Gynecology;  Laterality: Left;  . OVARIAN CYST REMOVAL    . TUBAL LIGATION       OB History    Gravida  2   Para  2   Term  1   Preterm  1   AB      Living  3     SAB      TAB      Ectopic      Multiple  1   Live Births               Home Medications    Prior to Admission medications   Medication Sig Start Date End Date Taking? Authorizing Provider  acetaminophen (TYLENOL) 500 MG tablet Take  1,000-1,500 mg by mouth every 4 (four) hours as needed (pain).    [provider]  aspirin-acetaminophen-caffeine (EXCEDRIN MIGRAINE) [phone] MG per tablet Take 2 tablets by mouth every 6 (six) hours as needed for headache or migraine.     [provider]  doxycycline (VIBRAMYCIN) 100 MG capsule Take 1 capsule (100 mg total) by mouth 2 (two) times daily. Patient not taking: Reported on 05/12/2017 06/03/15   Hoyle Sauer, MD  ferrous sulfate 325 (65 FE) MG tablet Take 1 tablet (325 mg total) by mouth daily. 05/12/17   Charisse March, MD  HYDROcodone-acetaminophen (NORCO/VICODIN) 5-325 MG tablet Take 1 tablet by mouth every 4 (four) hours as needed for severe pain. Patient not taking: Reported on 05/12/2017 06/03/15   Hoyle Sauer, MD  hydrOXYzine (ATARAX/VISTARIL) 25 MG tablet Take 1 tablet (25 mg total) by mouth every 6 (six) hours as needed for itching. Patient not taking: Reported on 05/12/2017 08/31/14   Lavonia Drafts, MD  ibuprofen (ADVIL,MOTRIN) 400 MG tablet Take 1 tablet (400 mg total) by mouth every 6 (six) hours as needed for  mild pain, moderate pain or cramping. Patient not taking: Reported on 05/12/2017 06/03/15   Hoyle Sauer, MD  naproxen sodium (ANAPROX) 220 MG tablet Take 440 mg by mouth as needed (pain).    [provider]  norgestimate-ethinyl estradiol (ORTHO-CYCLEN,SPRINTEC,PREVIFEM) 0.25-35 MG-MCG tablet Take 1 tablet by mouth daily. Patient not taking: Reported on 05/12/2017 06/03/15   Hoyle Sauer, MD  ondansetron (ZOFRAN) 4 MG tablet Take 1 tablet (4 mg total) by mouth every 6 (six) hours. Patient not taking: Reported on 05/12/2017 06/03/15   Hoyle Sauer, MD    Family History No family history on file.  Social History Social History   Tobacco Use  . Smoking status: Never Smoker  . Smokeless tobacco: Never Used  Substance Use Topics  . Alcohol use: No  . Drug use: No     Allergies   Percocet  [oxycodone-acetaminophen] and Codeine   Review of Systems Review of Systems All other systems negative except as documented in the HPI. All pertinent positives and negatives as reviewed in the HPI.  Physical Exam Updated Vital Signs BP 95/73 (BP Location: Right Arm)   Pulse 77   Temp 98.9 F (37.2 C) (Oral)   Resp 20   LMP 11/19/2017   SpO2 100%   Physical Exam  Constitutional: She is oriented to person, place, and time. She appears well-developed and well-nourished. No distress.  HENT:  Head: Normocephalic and atraumatic.  Eyes: Pupils are equal, round, and reactive to light.  Pulmonary/Chest: Effort normal.  Musculoskeletal:       Left lower leg: She exhibits tenderness. She exhibits no bony tenderness, no swelling, no edema and no deformity.       Legs: Neurological: She is alert and oriented to person, place, and time.  Skin: Skin is warm and dry.  Psychiatric: She has a normal mood and affect.  Nursing note and vitals reviewed.    ED Treatments / Results  Labs (all labs ordered are listed, but only abnormal results are displayed) Labs Reviewed - No data to display  EKG None  Radiology Dg Tibia/fibula Left  Result Date: 11/29/2017 CLINICAL DATA:  Anterior proximal tibial bruising post fall. EXAM: LEFT TIBIA AND FIBULA - 2 VIEW COMPARISON:  None. FINDINGS: There is no evidence of fracture or other focal bone lesions. Subcutaneous fat stranding anteriorly at the level of the proximal tibia, likely representing bruising. IMPRESSION: No acute fracture or dislocation identified about the left tibia/fibula. Electronically Signed   By: Fidela Salisbury M.D.   On: 11/29/2017 16:31    Procedures Procedures (including critical care time)  Medications Ordered in ED Medications - No data to display   Initial Impression / Assessment and Plan / ED Course  I have reviewed the triage vital signs and the nursing notes.  Pertinent labs & imaging results that were  available during my care of the patient were reviewed by me and considered in my medical decision making (see chart for details).     Patient has contusion to the anterior left lower leg.  The x-rays did not show any abnormalities at this time.  An Ace wrap will be applied for support and compression.  Have advised her to ice and elevate the lower leg.  To return here as needed.  Told to follow-up with a primary doctor or urgent care as needed  Final Clinical Impressions(s) / ED Diagnoses   Final diagnoses:  None    ED Discharge Orders    None  Dalia Heading, PA-C 11/29/17 1639    Charlesetta Shanks, MD 11/30/17 [phone]

## 2017-11-29 NOTE — Discharge Instructions (Addendum)
Your x-rays did not show any broken bones.  Ice and elevate your leg.  Return here as needed.  Follow-up with a primary doctor or urgent care as needed.

## 2017-11-29 NOTE — ED Notes (Signed)
Pt left at this time with all belongings.  

## 2017-11-29 NOTE — ED Triage Notes (Signed)
Pt stepped in a hole and fell approx 10 days ago.  C/o pain and bruising to left lower leg.  Ambulatory to triage.  CMS intact.

## 2017-11-29 NOTE — ED Notes (Signed)
Pt in XRAY at this time, will return to treatment room when finished.

## 2017-12-14 ENCOUNTER — Encounter (HOSPITAL_COMMUNITY): Payer: Self-pay | Admitting: *Deleted

## 2017-12-14 ENCOUNTER — Other Ambulatory Visit: Payer: Self-pay

## 2017-12-14 ENCOUNTER — Emergency Department (HOSPITAL_COMMUNITY)
Admission: EM | Admit: 2017-12-14 | Discharge: 2017-12-14 | Disposition: A | Discharge status: Home / Self Care | Payer: Self-pay | Attending: Emergency Medicine | Admitting: Emergency Medicine

## 2017-12-14 DIAGNOSIS — W19XXXA Unspecified fall, initial encounter: Secondary | ICD-10-CM | POA: Insufficient documentation

## 2017-12-14 DIAGNOSIS — S8012XA Contusion of left lower leg, initial encounter: Secondary | ICD-10-CM

## 2017-12-14 DIAGNOSIS — I10 Essential (primary) hypertension: Secondary | ICD-10-CM | POA: Insufficient documentation

## 2017-12-14 DIAGNOSIS — Z79899 Other long term (current) drug therapy: Secondary | ICD-10-CM | POA: Insufficient documentation

## 2017-12-14 DIAGNOSIS — S8012XD Contusion of left lower leg, subsequent encounter: Secondary | ICD-10-CM | POA: Insufficient documentation

## 2017-12-14 NOTE — Discharge Instructions (Signed)
It was my pleasure taking care of you today!   Warm compresses to the affected area. Keep elevated as much as possible.   If no improvement in 5-7 days, you should schedule an appointment with the orthopedic doctor listed.   Return to ER for new or worsening symptoms, any additional concerns.

## 2017-12-14 NOTE — ED Provider Notes (Signed)
[facility] EMERGENCY DEPARTMENT Provider Note   CSN: [id number] Arrival date & time: 12/14/17  1746     History   Chief Complaint No chief complaint on file.   HPI Robin Larson is a 37 y.o. female.  The history is provided by the patient and medical records. No language interpreter was used.   Robin Larson is a 37 y.o. female who presents to the Emergency Department complaining of persistent pain to the left lower leg.  Patient had a fall 3 weeks ago where she hit the affected extremity.  She was seen in the emergency department at that time where x-ray was performed and negative.  She was discharged home with symptomatic home care instructions and instructed to follow-up with PCP if her symptoms persist.  She does not have a primary care doctor.  She came to the emergency department today because her pain has not improved.  She has tried ibuprofen with little improvement.  She does stand up on her feet for work nearly every day and does not feel like she is been able to really rest the leg as much as she should.  No other medications /treatments tried prior to arrival.  Denies any numbness, tingling or weakness.  She has been able to ambulate without any difficulty.  She denies any new areas of pain/swelling.   Past Medical History:  Diagnosis Date  . Anemia   . Headache   . Hypertension    no meds since approx 2 yrs ago 2013  . Ovarian cyst     Patient Active Problem List   Diagnosis Date Noted  . Left ovarian cyst 08/01/2014  . 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 CESAREAN SECTION  2001, 2004   x 2  . LAPAROTOMY Left 08/01/2014   Procedure: LAPAROTOMY/REMOVAL OF LEFT OVARIAN CYSTECTOMY;  Surgeon: Lavonia Drafts, MD;  Location: [facility];  Service: Gynecology;  Laterality: Left;  . OOPHORECTOMY Left 08/01/2014   Procedure: LEFT OOPHORECTOMY;  Surgeon: Lavonia Drafts, MD;  Location: [facility];   Service: Gynecology;  Laterality: Left;  . OVARIAN CYST REMOVAL    . TUBAL LIGATION       OB History    Gravida  2   Para  2   Term  1   Preterm  1   AB      Living  3     SAB      TAB      Ectopic      Multiple  1   Live Births               Home Medications    Prior to Admission medications   Medication Sig Start Date End Date Taking? Authorizing Provider  acetaminophen (TYLENOL) 500 MG tablet Take 1,000-1,500 mg by mouth every 4 (four) hours as needed (pain).    [provider]  aspirin-acetaminophen-caffeine (EXCEDRIN MIGRAINE) [phone] MG per tablet Take 2 tablets by mouth every 6 (six) hours as needed for headache or migraine.     [provider]  doxycycline (VIBRAMYCIN) 100 MG capsule Take 1 capsule (100 mg total) by mouth 2 (two) times daily. Patient not taking: Reported on 05/12/2017 06/03/15   Hoyle Sauer, MD  ferrous sulfate 325 (65 FE) MG tablet Take 1 tablet (325 mg total) by mouth daily. 05/12/17   Charisse March, MD  HYDROcodone-acetaminophen (NORCO/VICODIN) 5-325 MG tablet Take 1 tablet by mouth every 4 (four)  hours as needed for severe pain. Patient not taking: Reported on 05/12/2017 06/03/15   Hoyle Sauer, MD  hydrOXYzine (ATARAX/VISTARIL) 25 MG tablet Take 1 tablet (25 mg total) by mouth every 6 (six) hours as needed for itching. Patient not taking: Reported on 05/12/2017 08/31/14   Lavonia Drafts, MD  ibuprofen (ADVIL,MOTRIN) 800 MG tablet Take 1 tablet (800 mg total) by mouth every 8 (eight) hours as needed. 11/29/17   Lawyer, Harrell Gave, PA-C  naproxen sodium (ANAPROX) 220 MG tablet Take 440 mg by mouth as needed (pain).    [provider]  norgestimate-ethinyl estradiol (ORTHO-CYCLEN,SPRINTEC,PREVIFEM) 0.25-35 MG-MCG tablet Take 1 tablet by mouth daily. Patient not taking: Reported on 05/12/2017 06/03/15   Hoyle Sauer, MD  ondansetron (ZOFRAN) 4 MG tablet Take 1 tablet (4 mg total) by mouth every 6  (six) hours. Patient not taking: Reported on 05/12/2017 06/03/15   Hoyle Sauer, MD    Family History No family history on file.  Social History Social History   Tobacco Use  . Smoking status: Never Smoker  . Smokeless tobacco: Never Used  Substance Use Topics  . Alcohol use: No  . Drug use: No     Allergies   Percocet [oxycodone-acetaminophen] and Codeine   Review of Systems Review of Systems  Musculoskeletal: Positive for myalgias.  Skin: Negative for wound.  Neurological: Negative for weakness and numbness.     Physical Exam Updated Vital Signs BP (!) 122/94 (BP Location: Left Arm)   Pulse 70   Temp 99.2 F (37.3 C) (Oral)   Resp 18   LMP 11/19/2017   SpO2 98%   Physical Exam  Constitutional: She appears well-developed and well-nourished. No distress.  HENT:  Head: Normocephalic and atraumatic.  Neck: Neck supple.  Cardiovascular: Normal rate, regular rhythm and normal heart sounds.  No murmur heard. Pulmonary/Chest: Effort normal and breath sounds normal. No respiratory distress. She has no wheezes. She has no rales.  Musculoskeletal: Normal range of motion. She exhibits no deformity.       Left knee: She exhibits normal range of motion, no erythema, normal alignment and no bony tenderness.       Legs: Neurological: She is alert.  Bilateral lower extremities neurovascularly intact.   Skin: Skin is warm and dry.  Nursing note and vitals reviewed.    ED Treatments / Results  Labs (all labs ordered are listed, but only abnormal results are displayed) Labs Reviewed - No data to display  EKG None  Radiology No results found.  Procedures Procedures (including critical care time)  Medications Ordered in ED Medications - No data to display   Initial Impression / Assessment and Plan / ED Course  I have reviewed the triage vital signs and the nursing notes.  Pertinent labs & imaging results that were available during my care of the patient  were reviewed by me and considered in my medical decision making (see chart for details).    Robin Larson is a 37 y.o. female who presents to ED for persistent pain to left lower extremity after a fall 3 weeks ago.  Seen in the emergency department for the same on 5/05 which was the day of initial injury.  Chart reviewed from this encounter.  X-ray was obtained and negative.  Informed to follow-up with PCP or urgent care if symptoms persisted.  She does not have a PCP and came to ER instead of urgent care today.  Neurovascularly intact with soft compartments.  Does have tenderness to the same  area as described at previous.  She reports no new areas of pain or swelling.  Further symptomatic care instructions were discussed including warm compresses, rest, etc.  Continue Tylenol and/or motrin.  Referred to orthopedics. Return precautions discussed and all questions answered.    Final Clinical Impressions(s) / ED Diagnoses   Final diagnoses:  Contusion of left lower extremity, initial encounter    ED Discharge Orders    None       Robin Larson, Ozella Almond, PA-C 12/14/17 2058    Nat Christen, MD 12/17/17 [phone]

## 2017-12-14 NOTE — ED Triage Notes (Signed)
Pt fell 3 weeks ago from hole in pavement and hurt left lower leg, was checked and negative for fracture but still has swelling.  Pt also has breast tenderness under her breast.  LMP end of April.

## 2018-09-18 ENCOUNTER — Encounter (HOSPITAL_COMMUNITY): Payer: Self-pay | Admitting: *Deleted

## 2018-09-18 ENCOUNTER — Emergency Department (HOSPITAL_COMMUNITY): Payer: Self-pay

## 2018-09-18 ENCOUNTER — Other Ambulatory Visit: Payer: Self-pay

## 2018-09-18 ENCOUNTER — Emergency Department (HOSPITAL_COMMUNITY)
Admission: EM | Admit: 2018-09-18 | Discharge: 2018-09-18 | Discharge status: Against Medical Advice | Payer: Self-pay | Attending: Emergency Medicine | Admitting: Emergency Medicine

## 2018-09-18 DIAGNOSIS — R079 Chest pain, unspecified: Secondary | ICD-10-CM | POA: Insufficient documentation

## 2018-09-18 DIAGNOSIS — Z5321 Procedure and treatment not carried out due to patient leaving prior to being seen by health care provider: Secondary | ICD-10-CM | POA: Insufficient documentation

## 2018-09-18 LAB — CBC
HCT: 32.2 % — ABNORMAL LOW (ref 36.0–46.0)
Hemoglobin: 8.7 g/dL — ABNORMAL LOW (ref 12.0–15.0)
MCH: 15.8 pg — ABNORMAL LOW (ref 26.0–34.0)
MCHC: 27 g/dL — ABNORMAL LOW (ref 30.0–36.0)
MCV: 58.7 fL — ABNORMAL LOW (ref 80.0–100.0)
Platelets: 268 10*3/uL (ref 150–400)
RBC: 5.49 MIL/uL — ABNORMAL HIGH (ref 3.87–5.11)
RDW: 21.4 % — ABNORMAL HIGH (ref 11.5–15.5)
WBC: 7.5 10*3/uL (ref 4.0–10.5)
nRBC: 0 % (ref 0.0–0.2)

## 2018-09-18 LAB — BASIC METABOLIC PANEL
ANION GAP: 6 (ref 5–15)
BUN: 17 mg/dL (ref 6–20)
CO2: 23 mmol/L (ref 22–32)
Calcium: 8.7 mg/dL — ABNORMAL LOW (ref 8.9–10.3)
Chloride: 107 mmol/L (ref 98–111)
Creatinine, Ser: 0.74 mg/dL (ref 0.44–1.00)
GFR calc Af Amer: >60 mL/min (ref >60)
GLUCOSE: 90 mg/dL (ref 70–99)
Potassium: 4 mmol/L (ref 3.5–5.1)
Sodium: 136 mmol/L (ref 135–145)

## 2018-09-18 LAB — I-STAT TROPONIN, ED: Troponin i, poc: 0 ng/mL (ref 0.00–0.08)

## 2018-09-18 LAB — I-STAT BETA HCG BLOOD, ED (MC, WL, AP ONLY)

## 2018-09-18 MED ORDER — SODIUM CHLORIDE 0.9% FLUSH: 3.0000 mL | Freq: Once | INTRAVENOUS | Status: DC

## 2018-09-18 NOTE — ED Notes (Signed)
Pt states she is leaving due to wait.  Encouraged her to stay and explained the triage process.  Pt declined.  Armband cut off and pt leaving.

## 2018-09-18 NOTE — ED Triage Notes (Signed)
Pt has been having headaches for the past several days. While at work tonight, pt started having midsternal chest pain. Reports feeling like her iron may be low; has not been taking her iron pills due to constipation and it making her feel bad

## 2018-10-23 ENCOUNTER — Encounter (HOSPITAL_COMMUNITY): Payer: Self-pay | Admitting: Emergency Medicine

## 2018-10-23 ENCOUNTER — Emergency Department (HOSPITAL_COMMUNITY)
Admission: EM | Admit: 2018-10-23 | Discharge: 2018-10-23 | Disposition: A | Discharge status: Home / Self Care | Payer: Self-pay | Attending: Emergency Medicine | Admitting: Emergency Medicine

## 2018-10-23 ENCOUNTER — Other Ambulatory Visit: Payer: Self-pay

## 2018-10-23 ENCOUNTER — Emergency Department (HOSPITAL_COMMUNITY): Payer: Self-pay

## 2018-10-23 DIAGNOSIS — R079 Chest pain, unspecified: Secondary | ICD-10-CM | POA: Insufficient documentation

## 2018-10-23 DIAGNOSIS — Z79899 Other long term (current) drug therapy: Secondary | ICD-10-CM | POA: Insufficient documentation

## 2018-10-23 DIAGNOSIS — D649 Anemia, unspecified: Secondary | ICD-10-CM | POA: Insufficient documentation

## 2018-10-23 LAB — BASIC METABOLIC PANEL
Anion gap: 7 (ref 5–15)
BUN: 9 mg/dL (ref 6–20)
CO2: 20 mmol/L — ABNORMAL LOW (ref 22–32)
Calcium: 8.4 mg/dL — ABNORMAL LOW (ref 8.9–10.3)
Chloride: 107 mmol/L (ref 98–111)
Creatinine, Ser: 0.85 mg/dL (ref 0.44–1.00)
GFR calc Af Amer: >60 mL/min (ref >60)
GFR calc non Af Amer: >60 mL/min (ref >60)
GLUCOSE: 89 mg/dL (ref 70–99)
Potassium: 4.2 mmol/L (ref 3.5–5.1)
Sodium: 134 mmol/L — ABNORMAL LOW (ref 135–145)

## 2018-10-23 LAB — CBC
HCT: 33.2 % — ABNORMAL LOW (ref 36.0–46.0)
Hemoglobin: 8.9 g/dL — ABNORMAL LOW (ref 12.0–15.0)
MCH: 15.4 pg — ABNORMAL LOW (ref 26.0–34.0)
MCHC: 26.8 g/dL — ABNORMAL LOW (ref 30.0–36.0)
MCV: 57.4 fL — ABNORMAL LOW (ref 80.0–100.0)
Platelets: 203 10*3/uL (ref 150–400)
RBC: 5.78 MIL/uL — ABNORMAL HIGH (ref 3.87–5.11)
RDW: 22.2 % — ABNORMAL HIGH (ref 11.5–15.5)
WBC: 5.1 10*3/uL (ref 4.0–10.5)
nRBC: 0 % (ref 0.0–0.2)

## 2018-10-23 LAB — I-STAT BETA HCG BLOOD, ED (MC, WL, AP ONLY): I-stat hCG, quantitative: <5 m[IU]/mL (ref <5)

## 2018-10-23 LAB — TROPONIN I
Troponin I: <0.03 ng/mL (ref <0.03)
Troponin I: <0.03 ng/mL (ref <0.03)

## 2018-10-23 MED ORDER — ASPIRIN 81 MG PO CHEW
324.0000 mg | CHEWABLE_TABLET | Freq: Once | ORAL | Status: AC
  Administered 2018-10-23: 324 mg via ORAL
  Filled 2018-10-23: qty 4

## 2018-10-23 MED ORDER — SODIUM CHLORIDE 0.9% FLUSH: 3.0000 mL | Freq: Once | INTRAVENOUS | Status: DC

## 2018-10-23 MED ORDER — FERROUS SULFATE 325 (65 FE) MG PO TABS: 325.0000 mg | ORAL_TABLET | Freq: Every day | ORAL | 0 refills | Status: DC

## 2018-10-23 MED ORDER — ALUM & MAG HYDROXIDE-SIMETH 200-200-20 MG/5ML PO SUSP
30.0000 mL | Freq: Once | ORAL | Status: AC
  Administered 2018-10-23: 30 mL via ORAL
  Filled 2018-10-23: qty 30

## 2018-10-23 MED ORDER — LIDOCAINE VISCOUS HCL 2 % MT SOLN
15.0000 mL | Freq: Once | OROMUCOSAL | Status: AC
  Administered 2018-10-23: 15 mL via ORAL
  Filled 2018-10-23: qty 15

## 2018-10-23 NOTE — ED Notes (Signed)
Pt back from x-ray.

## 2018-10-23 NOTE — ED Triage Notes (Signed)
Pt here with chest pain for 3 days. States it is radiating to left shoulder. Denies fever SOB or other symptoms

## 2018-10-23 NOTE — ED Provider Notes (Signed)
[facility] EMERGENCY DEPARTMENT Provider Note   CSN: [id number] Arrival date & time: 10/23/18  1205    History   Chief Complaint Chief Complaint  Patient presents with  . Chest Pain    HPI    Robin Larson is a 38 y.o. female with a PMHx of HTN, headaches, anemia, and ovarian cysts s/p left oophorectomy, who presents to the ED with complaints of chest pain that initially began about a month ago but got better until about 3 days ago, again improved after ibuprofen but then returned today around 9 AM while she was laying down.  She describes this pain is 6/10 constant pressure in the left side of her chest that radiates to her left shoulder, with no known aggravating factors, unchanged with inspiration or exertion, and somewhat improved with ibuprofen.  She reports associated mild nausea as well.  She is a non-smoker with no known family history of cardiac disease.  She denies any fevers, chills, cough, URI symptoms, diaphoresis, lightheadedness, shortness of breath, leg swelling, recent travel/surgery/immobilization, estrogen use, personal or family history of DVT/PE, abdominal pain, vomiting, diarrhea, constipation, dysuria, hematuria, myalgias, arthralgias, numbness, tingling, focal weakness, claudication, orthopnea, or any other complaints at this time.  He does not currently have a PCP.  The history is provided by the patient and medical records. No language interpreter was used.  Chest Pain  Associated symptoms: nausea   Associated symptoms: no abdominal pain, no cough, no diaphoresis, no fever, no numbness, no shortness of breath, no vomiting and no weakness     Past Medical History:  Diagnosis Date  . Anemia   . Headache   . Hypertension    no meds since approx 2 yrs ago 2013  . Ovarian cyst     Patient Active Problem List   Diagnosis Date Noted  . Left ovarian cyst 08/01/2014  . 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 CESAREAN SECTION  2001, 2004   x 2  . LAPAROTOMY Left 08/01/2014   Procedure: LAPAROTOMY/REMOVAL OF LEFT OVARIAN CYSTECTOMY;  Surgeon: Lavonia Drafts, MD;  Location: [facility];  Service: Gynecology;  Laterality: Left;  . OOPHORECTOMY Left 08/01/2014   Procedure: LEFT OOPHORECTOMY;  Surgeon: Lavonia Drafts, MD;  Location: [facility];  Service: Gynecology;  Laterality: Left;  . OVARIAN CYST REMOVAL    . TUBAL LIGATION       OB History    Gravida  2   Para  2   Term  1   Preterm  1   AB      Living  3     SAB      TAB      Ectopic      Multiple  1   Live Births               Home Medications    Prior to Admission medications   Medication Sig Start Date End Date Taking? Authorizing Provider  acetaminophen (TYLENOL) 500 MG tablet Take 1,000-1,500 mg by mouth every 4 (four) hours as needed (pain).    [provider]  aspirin-acetaminophen-caffeine (EXCEDRIN MIGRAINE) [phone] MG per tablet Take 2 tablets by mouth every 6 (six) hours as needed for headache or migraine.     [provider]  doxycycline (VIBRAMYCIN) 100 MG capsule Take 1 capsule (100 mg total) by mouth 2 (two) times daily. Patient not taking: Reported on 05/12/2017 06/03/15   Hoyle Sauer, MD  ferrous sulfate 325 (65 FE) MG tablet Take 1 tablet (325 mg total) by mouth daily. 05/12/17   Charisse March, MD  HYDROcodone-acetaminophen (NORCO/VICODIN) 5-325 MG tablet Take 1 tablet by mouth every 4 (four) hours as needed for severe pain. Patient not taking: Reported on 05/12/2017 06/03/15   Hoyle Sauer, MD  hydrOXYzine (ATARAX/VISTARIL) 25 MG tablet Take 1 tablet (25 mg total) by mouth every 6 (six) hours as needed for itching. Patient not taking: Reported on 05/12/2017 08/31/14   Lavonia Drafts, MD  ibuprofen (ADVIL,MOTRIN) 800 MG tablet Take 1 tablet (800 mg total) by mouth every 8 (eight) hours as needed. 11/29/17   Lawyer, Harrell Gave, PA-C  naproxen  sodium (ANAPROX) 220 MG tablet Take 440 mg by mouth as needed (pain).    [provider]  norgestimate-ethinyl estradiol (ORTHO-CYCLEN,SPRINTEC,PREVIFEM) 0.25-35 MG-MCG tablet Take 1 tablet by mouth daily. Patient not taking: Reported on 05/12/2017 06/03/15   Hoyle Sauer, MD  ondansetron (ZOFRAN) 4 MG tablet Take 1 tablet (4 mg total) by mouth every 6 (six) hours. Patient not taking: Reported on 05/12/2017 06/03/15   Hoyle Sauer, MD    Family History No family history on file.  Social History Social History   Tobacco Use  . Smoking status: Never Smoker  . Smokeless tobacco: Never Used  Substance Use Topics  . Alcohol use: No  . Drug use: No     Allergies   Percocet [oxycodone-acetaminophen] and Codeine   Review of Systems Review of Systems  Constitutional: Negative for chills, diaphoresis and fever.  HENT: Negative for rhinorrhea and sore throat.   Respiratory: Negative for cough and shortness of breath.   Cardiovascular: Positive for chest pain. Negative for leg swelling.  Gastrointestinal: Positive for nausea. Negative for abdominal pain, constipation, diarrhea and vomiting.  Genitourinary: Negative for dysuria and hematuria.  Musculoskeletal: Negative for arthralgias and myalgias.  Skin: Negative for color change.  Allergic/Immunologic: Negative for immunocompromised state.  Neurological: Negative for weakness, light-headedness and numbness.  Psychiatric/Behavioral: Negative for confusion.   All other systems reviewed and are negative for acute change except as noted in the HPI.    Physical Exam Updated Vital Signs BP 122/79 (BP Location: Right Arm)   Pulse 76   Temp 99.4 F (37.4 C) (Oral)   Resp 19   Ht 5\' 3"  (1.6 m)   Wt 117.9 kg   SpO2 100%   BMI 46.06 kg/m   Physical Exam Vitals signs and nursing note reviewed.  Constitutional:      General: She is not in acute distress.    Appearance: Normal appearance. She is well-developed. She is  not toxic-appearing.     Comments: Afebrile, nontoxic, NAD  HENT:     Head: Normocephalic and atraumatic.  Eyes:     General:        Right eye: No discharge.        Left eye: No discharge.     Conjunctiva/sclera: Conjunctivae normal.  Neck:     Musculoskeletal: Normal range of motion and neck supple.  Cardiovascular:     Rate and Rhythm: Normal rate and regular rhythm.     Pulses: Normal pulses.     Heart sounds: Normal heart sounds, S1 normal and S2 normal. No murmur. No friction rub. No gallop.      Comments: RRR, nl s1/s2, no m/r/g, distal pulses intact, no pedal edema  Pulmonary:     Effort: Pulmonary effort is normal. No respiratory distress.     Breath sounds: Normal breath  sounds. No decreased breath sounds, wheezing, rhonchi or rales.     Comments: CTAB in all lung fields, no w/r/r, no hypoxia or increased WOB, speaking in full sentences, SpO2 100% on RA  Chest:     Chest wall: Tenderness present. No deformity or crepitus.       Comments: Chest wall with mild TTP to L anterior chest wall, without crepitus, deformities, or retractions  Abdominal:     General: Bowel sounds are normal. There is no distension.     Palpations: Abdomen is soft. Abdomen is not rigid.     Tenderness: There is no abdominal tenderness. There is no right CVA tenderness, left CVA tenderness, guarding or rebound. Negative signs include Murphy's sign and McBurney's sign.  Musculoskeletal: Normal range of motion.     Comments: No L shoulder tenderness MAE x4 Strength and sensation grossly intact in all extremities Distal pulses intact No pedal edema, neg homan's bilaterally   Skin:    General: Skin is warm and dry.     Findings: No rash.  Neurological:     Mental Status: She is alert and oriented to person, place, and time.     Sensory: Sensation is intact. No sensory deficit.     Motor: Motor function is intact.  Psychiatric:        Mood and Affect: Mood and affect normal.        Behavior:  Behavior normal.      ED Treatments / Results  Labs (all labs ordered are listed, but only abnormal results are displayed) Labs Reviewed  BASIC METABOLIC PANEL - Abnormal; Notable for the following components:      Result Value   Sodium 134 (*)    CO2 20 (*)    Calcium 8.4 (*)    All other components within normal limits  CBC - Abnormal; Notable for the following components:   RBC 5.78 (*)    Hemoglobin 8.9 (*)    HCT 33.2 (*)    MCV 57.4 (*)    MCH 15.4 (*)    MCHC 26.8 (*)    RDW 22.2 (*)    All other components within normal limits  TROPONIN I  TROPONIN I  I-STAT BETA HCG BLOOD, ED ([facility], [facility], [facility] ONLY)    EKG EKG Interpretation  Date/Time:  Saturday October 23 2018 12:20:27 EDT Ventricular Rate:  83 PR Interval:    QRS Duration: 83 QT Interval:  352 QTC Calculation: 414 R Axis:   20 Text Interpretation:  Sinus rhythm Ventricular trigeminy Consider right atrial enlargement Low voltage, precordial leads Abnormal R-wave progression, early transition Since last tracing Confirmed by Daleen Bo [phone]) on 10/23/2018 12:41:23 PM    Radiology Dg Chest 2 View  Result Date: 10/23/2018 CLINICAL DATA:  Chest pain for 3 days. EXAM: CHEST - 2 VIEW COMPARISON:  September 18, 2018 FINDINGS: No pneumothorax. Stable cardiomediastinal silhouette. Prominence of the mediastinum on the right was previously suggested represent a right-sided aortic arch. The lungs are clear. No other acute abnormalities. IMPRESSION: No interval change. 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 Electronically Signed   By: Dorise Bullion III M.D   On: 10/23/2018 13:11    Procedures Procedures (including critical care time)  Medications Ordered in ED Medications  sodium chloride flush (NS) 0.9 % injection 3 mL (has no administration in time range)  aspirin chewable tablet 324 mg (324 mg Oral Given 10/23/18 1338)  alum & mag  hydroxide-simeth  (MAALOX/MYLANTA) 200-200-20 MG/5ML suspension 30 mL (30 mLs Oral Given 10/23/18 1338)    And  lidocaine (XYLOCAINE) 2 % viscous mouth solution 15 mL (15 mLs Oral Given 10/23/18 1338)     Initial Impression / Assessment and Plan / ED Course  I have reviewed the triage vital signs and the nursing notes.  Pertinent labs & imaging results that were available during my care of the patient were reviewed by me and considered in my medical decision making (see chart for details).        38 y.o. female here with left-sided chest pain that started about a month ago but got better until about 3 days ago when it came back and then went away again until today.  On exam, reproducible chest wall tenderness on the left anterior chest, no tachycardia or hypoxia, no leg swelling, clear lung sounds.  No abdominal tenderness.  Could be musculoskeletal.  Doubt PE, dissection.  EKG with PVCs but otherwise no acute ischemic changes.  Heart pathway score 3.  Will get labs, chest x-ray, give aspirin and GI cocktail, and reassess shortly. Discussed case with my attending Dr. Eulis Foster who agrees with plan.   1:41 PM CBC with chronic fairly stable anemia. BMP fairly unremarkable. Trop neg at 3hr mark. BetaHCG neg. CXR without acute findings, incidentally noted mild prominence of right side of mediastinum favored to represent right sided aortic arch, doubt clinical significance with today's visit, likely just anatomical variant. Will get second trop and reassess after that. Pt just now receiving meds.   4:30 PM Second trop neg. Pt feeling better after meds. Tolerating PO well. Suspect musculoskeletal etiology of her pain, vs some component of indigestion since the GI cocktail helped. Anemia also may be contributing, however pt asymptomatic from that regard, doubt need for urgent transfusion, will start on iron therapy. Advised OTC remedies for symptomatic relief, and f/up with [facility] in 1wk for recheck and to establish medical  care. I explained the diagnosis and have given explicit precautions to return to the ER including for any other new or worsening symptoms. The patient understands and accepts the medical plan as it's been dictated and I have answered their questions. Discharge instructions concerning home care and prescriptions have been given. The patient is STABLE and is discharged to home in good condition.    Final Clinical Impressions(s) / ED Diagnoses   Final diagnoses:  Left-sided chest pain  Chronic anemia    ED Discharge Orders         Ordered    ferrous sulfate 325 (65 FE) MG tablet  Daily with breakfast     10/23/18 [address] [place], [place] 10/23/18 1631    Daleen Bo, MD 10/24/18 [phone]

## 2018-10-23 NOTE — Discharge Instructions (Addendum)
Your xray and EKG are reassuring, your labs show that you're anemic so you will need to start on iron; your chest pain could be a variety of things including gas pain, muscle pain, indigestion, and other nonemergent issues. Alternate between tylenol and motrin as directed as needed for pain, always taking these on a full stomach and NEVER on an empty stomach. Stay well hydrated. You may consider using heat to the areas of pain, no more than 20 minutes every hour. If you think indigestion could be contributing to your symptoms, you may use over the counter tums, maalox, pepto bismol, zantac, etc to help with symptoms; avoid spicy/fatty/fried/acidic foods. Follow up with the [facility] in 1 week for recheck of symptoms and to establish medical care. Return to the ER for changes or worsening symptoms.  SEEK IMMEDIATE MEDICAL ATTENTION IF: You develop a fever.  Your chest pains become severe or intolerable.  You develop new, unexplained symptoms (problems).  You develop shortness of breath, nausea, vomiting, sweating or feel light headed.  You develop a new cough or you cough up blood. You develop new leg swelling

## 2018-10-23 NOTE — ED Notes (Signed)
Pt verbalized understanding of d/c instructions and has no further questions, VSS, NAD.  

## 2019-05-22 IMAGING — DX DG TIBIA/FIBULA 2V*L*
2 series · 2 of 2 positions shown · non-contrast
Comparison: None.

CLINICAL DATA: Anterior proximal tibial bruising post fall.

EXAM:
LEFT TIBIA AND FIBULA - 2 VIEW

[tibia ap]
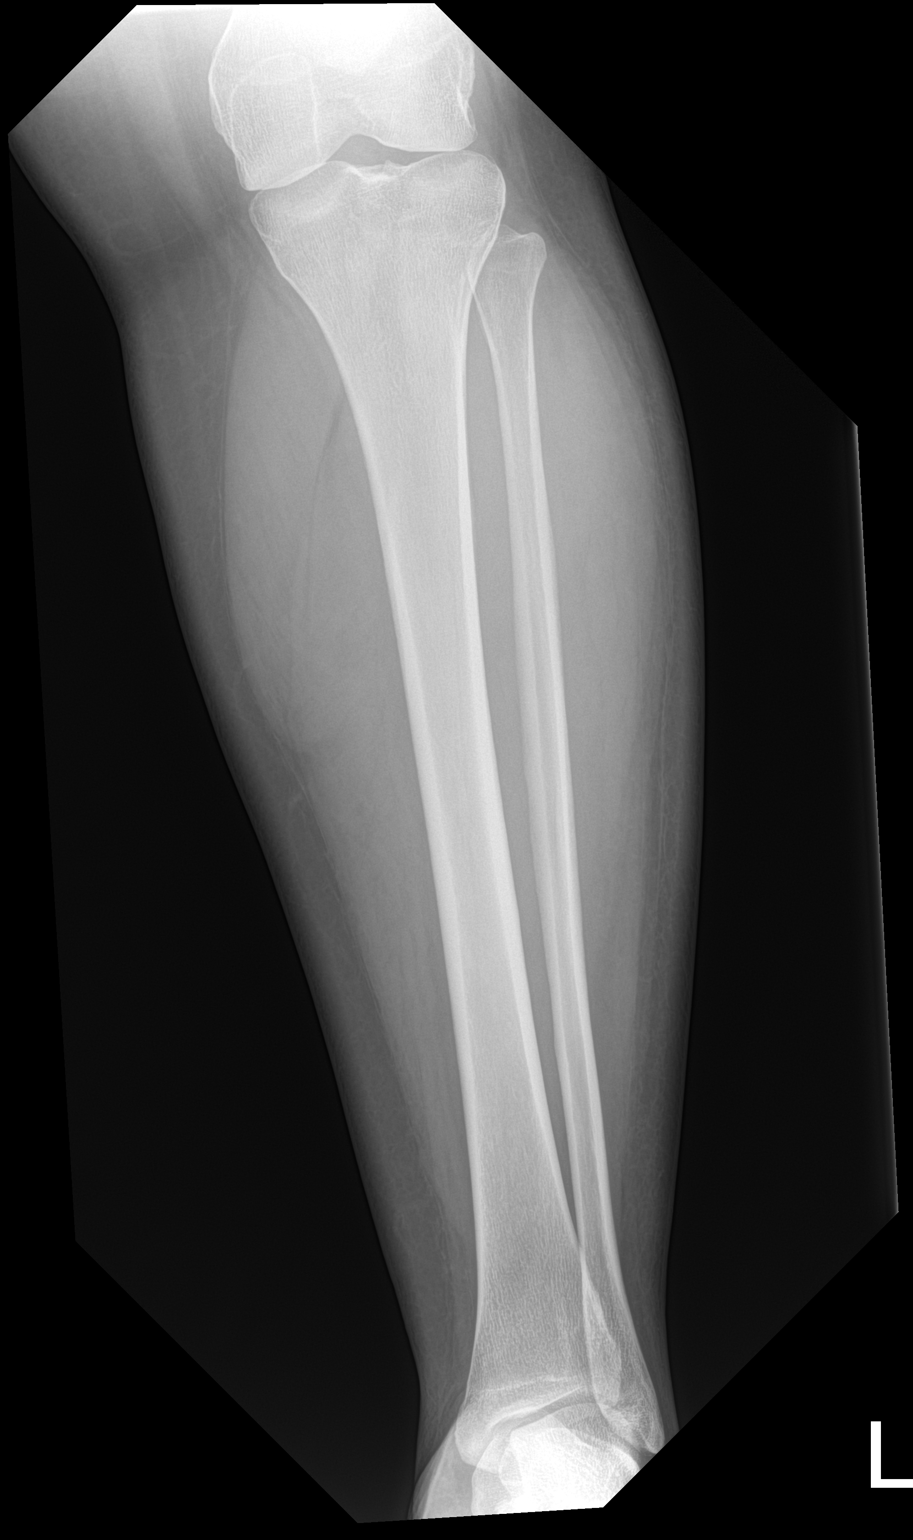

[tibia lat]
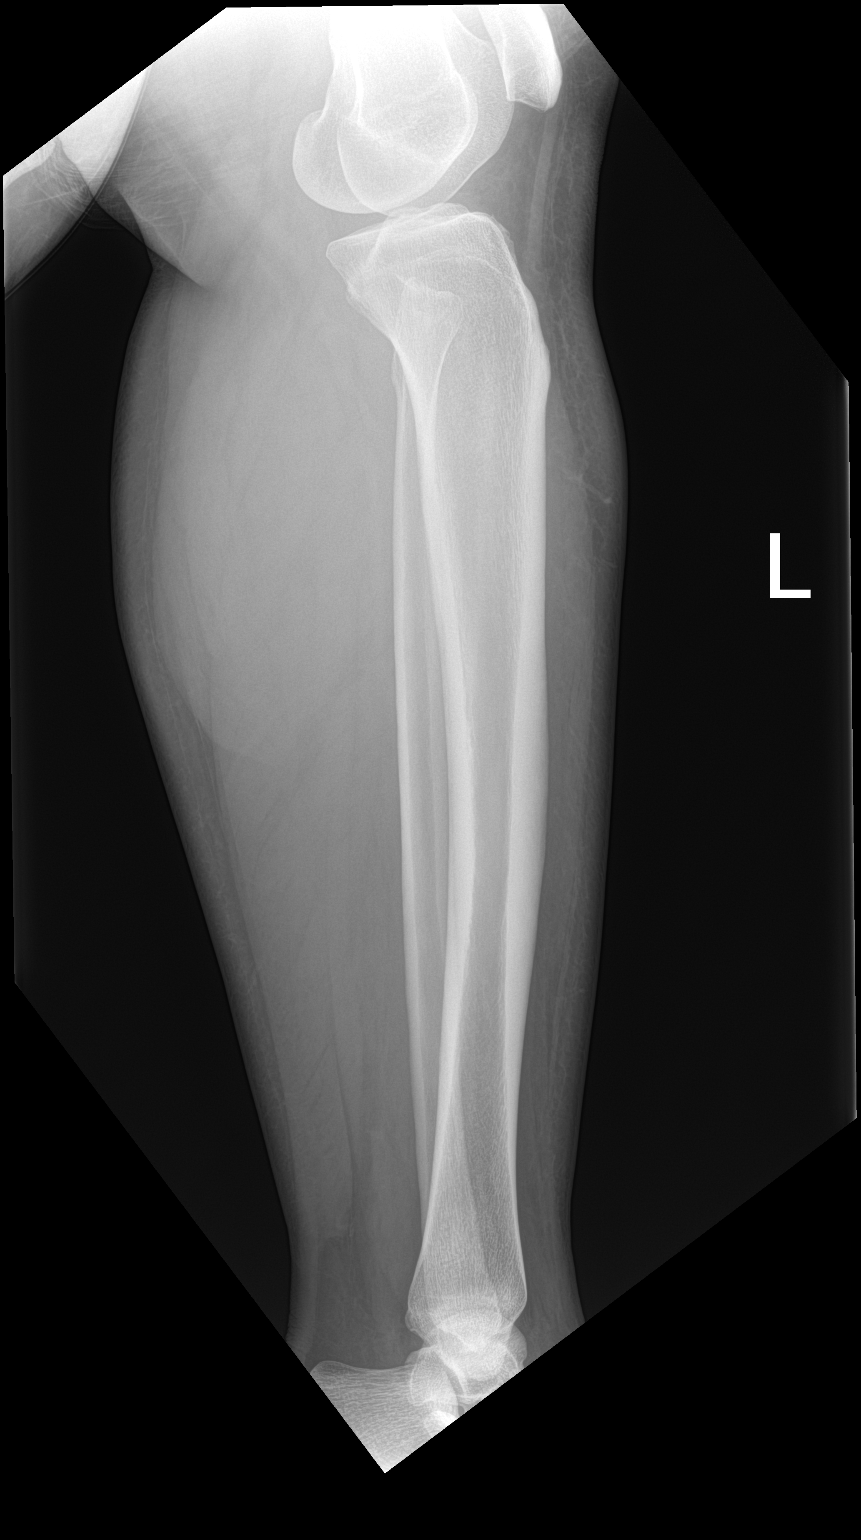

[2 of 2 positions shown; findings below may reference images not displayed]

FINDINGS: There is no evidence of fracture or other focal bone lesions.
Subcutaneous fat stranding anteriorly at the level of the proximal
tibia, likely representing bruising.
IMPRESSION: No acute fracture or dislocation identified about the left
tibia/fibula.

## 2019-12-15 ENCOUNTER — Emergency Department (HOSPITAL_COMMUNITY)
Admission: EM | Admit: 2019-12-15 | Discharge: 2019-12-15 | Disposition: A | Discharge status: Home / Self Care | Payer: Self-pay | Attending: Emergency Medicine | Admitting: Emergency Medicine

## 2019-12-15 ENCOUNTER — Encounter (HOSPITAL_COMMUNITY): Payer: Self-pay | Admitting: *Deleted

## 2019-12-15 DIAGNOSIS — I10 Essential (primary) hypertension: Secondary | ICD-10-CM | POA: Insufficient documentation

## 2019-12-15 DIAGNOSIS — N938 Other specified abnormal uterine and vaginal bleeding: Secondary | ICD-10-CM | POA: Insufficient documentation

## 2019-12-15 LAB — CBC
HCT: 30.9 % — ABNORMAL LOW (ref 36.0–46.0)
Hemoglobin: 8.2 g/dL — ABNORMAL LOW (ref 12.0–15.0)
MCH: 14.8 pg — ABNORMAL LOW (ref 26.0–34.0)
MCHC: 26.5 g/dL — ABNORMAL LOW (ref 30.0–36.0)
MCV: 55.8 fL — ABNORMAL LOW (ref 80.0–100.0)
Platelets: 248 10*3/uL (ref 150–400)
RBC: 5.54 MIL/uL — ABNORMAL HIGH (ref 3.87–5.11)
RDW: 24.3 % — ABNORMAL HIGH (ref 11.5–15.5)
WBC: 6 10*3/uL (ref 4.0–10.5)
nRBC: 0 % (ref 0.0–0.2)

## 2019-12-15 LAB — WET PREP, GENITAL
Clue Cells Wet Prep HPF POC: NONE SEEN
Sperm: NONE SEEN
Trich, Wet Prep: NONE SEEN
Yeast Wet Prep HPF POC: NONE SEEN

## 2019-12-15 LAB — I-STAT BETA HCG BLOOD, ED (MC, WL, AP ONLY): I-stat hCG, quantitative: <5 m[IU]/mL (ref <5)

## 2019-12-15 MED ORDER — MEGESTROL ACETATE 40 MG PO TABS: 40.0000 mg | ORAL_TABLET | Freq: Every day | ORAL | 0 refills | Status: DC

## 2019-12-15 NOTE — ED Triage Notes (Signed)
To ED for eval of vaginal bleeding since 5/1. Pt states she rarely misses her period. States she missed April's then it started May 1st. States she took a pregnancy test at home - negative. No abd pain. Hx of tubaligation.

## 2019-12-15 NOTE — Discharge Instructions (Addendum)
Start Megace for heavy bleeding - take 40mg  once daily  Take Ibuprofen 400mg  every 6-8 hours as needed for pelvic cramping and bleeding Please follow up with OBGYN

## 2019-12-15 NOTE — ED Provider Notes (Signed)
[facility] EMERGENCY DEPARTMENT Provider Note   CSN: [id number] Arrival date & time: 12/15/19  [id number]     History Chief Complaint  Patient presents with  . Vaginal Bleeding    Robin Larson is a 39 y.o.G2P3 female with history of ovarian cyst status post left salpingoectomy-oopherectomy in 2016 presents with heavy vaginal bleeding.  She states that she missed her period in April which she thinks was from being overly stressed.  She started to have spotting on May 1.  Since then she has had bleeding every day.  Sometimes it will be light spotting and sometimes heavy. She has been passing multiple clots.  On heavy days she will go through 5 or more pads a day.  She denies any pelvic pain or vaginal discharge.  She is not on any hormones. She reports chronic anemia but doesn't take iron because it makes her constipated. She does not have an OBGYN. She is unsure if she has fibroids. She denies lightheadedness or syncope.  HPI     Past Medical History:  Diagnosis Date  . Anemia   . Headache   . Hypertension    no meds since approx 2 yrs ago 2013  . Ovarian cyst     Patient Active Problem List   Diagnosis Date Noted  . Left ovarian cyst 08/01/2014  . 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 CESAREAN SECTION  2001, 2004   x 2  . LAPAROTOMY Left 08/01/2014   Procedure: LAPAROTOMY/REMOVAL OF LEFT OVARIAN CYSTECTOMY;  Surgeon: Lavonia Drafts, MD;  Location: [facility];  Service: Gynecology;  Laterality: Left;  . OOPHORECTOMY Left 08/01/2014   Procedure: LEFT OOPHORECTOMY;  Surgeon: Lavonia Drafts, MD;  Location: [facility];  Service: Gynecology;  Laterality: Left;  . OVARIAN CYST REMOVAL    . TUBAL LIGATION       OB History    Gravida  2   Para  2   Term  1   Preterm  1   AB      Living  3     SAB      TAB      Ectopic      Multiple  1   Live Births              No family history on  file.  Social History   Tobacco Use  . Smoking status: Never Smoker  . Smokeless tobacco: Never Used  Substance Use Topics  . Alcohol use: No  . Drug use: No    Home Medications Prior to Admission medications   Medication Sig Start Date End Date Taking? Authorizing Provider  acetaminophen (TYLENOL) 500 MG tablet Take 1,000-1,500 mg by mouth every 4 (four) hours as needed (pain).   Yes [provider]  aspirin-acetaminophen-caffeine (EXCEDRIN MIGRAINE) [phone] MG per tablet Take 2 tablets by mouth every 6 (six) hours as needed for headache or migraine.    Yes [provider]    Allergies    Percocet [oxycodone-acetaminophen] and Codeine  Review of Systems   Review of Systems  Gastrointestinal: Negative for abdominal pain.  Genitourinary: Positive for menstrual problem and vaginal bleeding. Negative for pelvic pain, vaginal discharge and vaginal pain.  Neurological: Negative for syncope and light-headedness.  All other systems reviewed and are negative.   Physical Exam Updated Vital Signs BP (!) 125/99   Pulse 79   Temp 98.5 F (36.9 C) (Oral)   Resp  16   Ht 5\' 3"  (1.6 m)   Wt 118.8 kg   LMP 11/26/2019 (Exact Date)   SpO2 100%   BMI 46.41 kg/m   Physical Exam Vitals and nursing note reviewed.  Constitutional:      General: She is not in acute distress.    Appearance: She is well-developed. She is obese. She is not ill-appearing.     Comments: Calm, cooperative. NAD  HENT:     Head: Normocephalic and atraumatic.  Eyes:     General: No scleral icterus.       Right eye: No discharge.        Left eye: No discharge.     Conjunctiva/sclera: Conjunctivae normal.     Pupils: Pupils are equal, round, and reactive to light.  Cardiovascular:     Rate and Rhythm: Normal rate and regular rhythm.  Pulmonary:     Effort: Pulmonary effort is normal. No respiratory distress.     Breath sounds: Normal breath sounds.  Abdominal:     General: There is no  distension.  Genitourinary:    Comments: Pelvic: No inguinal lymphadenopathy or inguinal hernia noted. Normal external genitalia. No pain with speculum insertion. Closed cervical os with normal appearance - no rash or lesions. Heavy bleeding coming from os with several small clots. On bimanual examination no adnexal tenderness or cervical motion tenderness. Chaperone present during exam.   Musculoskeletal:     Cervical back: Normal range of motion.  Skin:    General: Skin is warm and dry.  Neurological:     Mental Status: She is alert and oriented to person, place, and time.  Psychiatric:        Behavior: Behavior normal.     ED Results / Procedures / Treatments   Labs (all labs ordered are listed, but only abnormal results are displayed) Labs Reviewed  WET PREP, GENITAL - Abnormal; Notable for the following components:      Result Value   WBC, Wet Prep HPF POC FEW (*)    All other components within normal limits  CBC - Abnormal; Notable for the following components:   RBC 5.54 (*)    Hemoglobin 8.2 (*)    HCT 30.9 (*)    MCV 55.8 (*)    MCH 14.8 (*)    MCHC 26.5 (*)    RDW 24.3 (*)    All other components within normal limits  I-STAT BETA HCG BLOOD, ED ([facility], [facility], [facility] ONLY)  GC/CHLAMYDIA PROBE AMP ([place]) NOT AT [facility]    EKG None  Radiology No results found.  Procedures Procedures (including critical care time)  Medications Ordered in ED Medications - No data to display  ED Course  I have reviewed the triage vital signs and the nursing notes.  Pertinent labs & imaging results that were available during my care of the patient were reviewed by me and considered in my medical decision making (see chart for details).  39 year old female presents with heavy vaginal bleeding for the past 3 weeks. Vital signs are reassuring here. On exam heart is regular rate and rhythm. Lungs are clear to auscultation. Abdomen is soft and nontender. Pelvic exam was performed and  reveals steady bleeding from the cervical os with several small clots. She has no cervical motion or adnexal tenderness. CBC was obtained and shows stable chronic anemia. She is not pregnant. Ultrasound was reviewed from prior visits and shows a small uterine fibroid which could be the possible reason for her bleeding  versus hormonal imbalance. Patient states she is intolerant of iron. We will start her on Megace and have her follow-up with OB/GYN.  MDM Rules/Calculators/A&P                       Final Clinical Impression(s) / ED Diagnoses Final diagnoses:  DUB (dysfunctional uterine bleeding)    Rx / DC Orders ED Discharge Orders    None       Recardo Evangelist, PA-C 12/15/19 1226    Charlesetta Shanks, MD 12/24/19 [phone]

## 2019-12-16 LAB — GC/CHLAMYDIA PROBE AMP (~~LOC~~) NOT AT ARMC
Chlamydia: NEGATIVE
Comment: NEGATIVE
Comment: NORMAL
Neisseria Gonorrhea: NEGATIVE

## 2019-12-29 ENCOUNTER — Other Ambulatory Visit: Payer: Self-pay

## 2019-12-29 ENCOUNTER — Emergency Department (HOSPITAL_COMMUNITY)
Admission: EM | Admit: 2019-12-29 | Discharge: 2019-12-30 | Disposition: A | Discharge status: Home / Self Care | Payer: Self-pay | Attending: Emergency Medicine | Admitting: Emergency Medicine

## 2019-12-29 ENCOUNTER — Encounter (HOSPITAL_COMMUNITY): Payer: Self-pay | Admitting: Emergency Medicine

## 2019-12-29 DIAGNOSIS — I10 Essential (primary) hypertension: Secondary | ICD-10-CM | POA: Insufficient documentation

## 2019-12-29 DIAGNOSIS — D219 Benign neoplasm of connective and other soft tissue, unspecified: Secondary | ICD-10-CM

## 2019-12-29 DIAGNOSIS — Z79899 Other long term (current) drug therapy: Secondary | ICD-10-CM | POA: Insufficient documentation

## 2019-12-29 DIAGNOSIS — D259 Leiomyoma of uterus, unspecified: Secondary | ICD-10-CM | POA: Insufficient documentation

## 2019-12-29 MED ORDER — SODIUM CHLORIDE 0.9% FLUSH: 3.0000 mL | Freq: Once | INTRAVENOUS | Status: DC

## 2019-12-29 NOTE — ED Triage Notes (Signed)
Patient reports intermittent hypogastric abdominal pain onset last week with nausea , denies emesis or diarrhea , patient suspects fibroids flare up with intermittent vaginal spotting .

## 2019-12-30 ENCOUNTER — Emergency Department (HOSPITAL_COMMUNITY): Payer: Self-pay

## 2019-12-30 LAB — URINALYSIS, ROUTINE W REFLEX MICROSCOPIC
Bacteria, UA: NONE SEEN
Bilirubin Urine: NEGATIVE
Glucose, UA: NEGATIVE mg/dL
Ketones, ur: NEGATIVE mg/dL
Nitrite: NEGATIVE
Protein, ur: 30 mg/dL — AB
Specific Gravity, Urine: 1.031 — ABNORMAL HIGH (ref 1.005–1.030)
pH: 5 (ref 5.0–8.0)

## 2019-12-30 LAB — CBC
HCT: 28.5 % — ABNORMAL LOW (ref 36.0–46.0)
Hemoglobin: 7.6 g/dL — ABNORMAL LOW (ref 12.0–15.0)
MCH: 14.6 pg — ABNORMAL LOW (ref 26.0–34.0)
MCHC: 26.7 g/dL — ABNORMAL LOW (ref 30.0–36.0)
MCV: 54.6 fL — ABNORMAL LOW (ref 80.0–100.0)
Platelets: 235 10*3/uL (ref 150–400)
RBC: 5.22 MIL/uL — ABNORMAL HIGH (ref 3.87–5.11)
RDW: 24 % — ABNORMAL HIGH (ref 11.5–15.5)
WBC: 5.8 10*3/uL (ref 4.0–10.5)
nRBC: 0 % (ref 0.0–0.2)

## 2019-12-30 LAB — COMPREHENSIVE METABOLIC PANEL
ALT: 12 U/L (ref 0–44)
AST: 14 U/L — ABNORMAL LOW (ref 15–41)
Albumin: 3.5 g/dL (ref 3.5–5.0)
Alkaline Phosphatase: 42 U/L (ref 38–126)
Anion gap: 8 (ref 5–15)
BUN: 14 mg/dL (ref 6–20)
CO2: 21 mmol/L — ABNORMAL LOW (ref 22–32)
Calcium: 8.8 mg/dL — ABNORMAL LOW (ref 8.9–10.3)
Chloride: 107 mmol/L (ref 98–111)
Creatinine, Ser: 0.84 mg/dL (ref 0.44–1.00)
GFR calc Af Amer: >60 mL/min (ref >60)
GFR calc non Af Amer: >60 mL/min (ref >60)
Glucose, Bld: 96 mg/dL (ref 70–99)
Potassium: 3.6 mmol/L (ref 3.5–5.1)
Sodium: 136 mmol/L (ref 135–145)
Total Bilirubin: 0.5 mg/dL (ref 0.3–1.2)
Total Protein: 6.5 g/dL (ref 6.5–8.1)

## 2019-12-30 LAB — I-STAT BETA HCG BLOOD, ED (MC, WL, AP ONLY): I-stat hCG, quantitative: <5 m[IU]/mL (ref <5)

## 2019-12-30 LAB — LIPASE, BLOOD: Lipase: 38 U/L (ref 11–51)

## 2019-12-30 MED ORDER — KETOROLAC TROMETHAMINE 30 MG/ML IJ SOLN
30.0000 mg | Freq: Once | INTRAMUSCULAR | Status: AC
  Administered 2019-12-30: 30 mg via INTRAVENOUS
  Filled 2019-12-30: qty 1

## 2019-12-30 NOTE — Discharge Instructions (Addendum)
The ultrasound today shows a 5cm fibroid.  I believe this is the source of your pain.  Please notify your OB/GYN.  Please follow-up with them.  Take ibuprofen and use heat/ice as needed for discomfort.  If symptoms change or worsen, please return to the ER.

## 2019-12-30 NOTE — ED Provider Notes (Signed)
[facility] EMERGENCY DEPARTMENT Provider Note   CSN: [id number] Arrival date & time: 12/29/19  2307     History Chief Complaint  Patient presents with  . Abdominal Pain    Robin Larson is a 39 y.o. female.  Patient presents to the emergency department with a chief complaint of lower abdominal/pelvic pain.  She states that she has history of uterine fibroids.  She has had irregular menstrual cycles, and was seen recently for heavy vaginal bleeding.  She was put on a hormone pill, and states that the bleeding has since stopped.  She states that she continues to have pelvic pain.  She has follow-up appointment with OB/GYN at the end of this month.  She has been taking ibuprofen with little relief of her symptoms.  She denies any fever, chills, nausea, or vomiting.  Denies any other associated symptoms.  The history is provided by the patient. No language interpreter was used.       Past Medical History:  Diagnosis Date  . Anemia   . Headache   . Hypertension    no meds since approx 2 yrs ago 2013  . Ovarian cyst     Patient Active Problem List   Diagnosis Date Noted  . Left ovarian cyst 08/01/2014  . 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 CESAREAN SECTION  2001, 2004   x 2  . LAPAROTOMY Left 08/01/2014   Procedure: LAPAROTOMY/REMOVAL OF LEFT OVARIAN CYSTECTOMY;  Surgeon: Lavonia Drafts, MD;  Location: [facility];  Service: Gynecology;  Laterality: Left;  . OOPHORECTOMY Left 08/01/2014   Procedure: LEFT OOPHORECTOMY;  Surgeon: Lavonia Drafts, MD;  Location: [facility];  Service: Gynecology;  Laterality: Left;  . OVARIAN CYST REMOVAL    . TUBAL LIGATION       OB History    Gravida  2   Para  2   Term  1   Preterm  1   AB      Living  3     SAB      TAB      Ectopic      Multiple  1   Live Births              No family history on file.  Social History   Tobacco Use  .  Smoking status: Never Smoker  . Smokeless tobacco: Never Used  Substance Use Topics  . Alcohol use: No  . Drug use: No    Home Medications Prior to Admission medications   Medication Sig Start Date End Date Taking? Authorizing Provider  acetaminophen (TYLENOL) 500 MG tablet Take 1,000-1,500 mg by mouth every 4 (four) hours as needed (pain).    [provider]  aspirin-acetaminophen-caffeine (EXCEDRIN MIGRAINE) [phone] MG per tablet Take 2 tablets by mouth every 6 (six) hours as needed for headache or migraine.     [provider]  megestrol (MEGACE) 40 MG tablet Take 1 tablet (40 mg total) by mouth daily. 12/15/19   Recardo Evangelist, PA-C    Allergies    Percocet [oxycodone-acetaminophen] and Codeine  Review of Systems   Review of Systems  All other systems reviewed and are negative.   Physical Exam Updated Vital Signs BP 130/90 (BP Location: Right Arm)   Pulse 84   Temp 99.5 F (37.5 C) (Oral)   Resp 16   Ht 5\' 3"  (1.6 m)   Wt 130 kg   LMP  11/26/2019   SpO2 100%   BMI 50.77 kg/m   Physical Exam Vitals and nursing note reviewed.  Constitutional:      General: She is not in acute distress.    Appearance: She is well-developed.  HENT:     Head: Normocephalic and atraumatic.  Eyes:     Conjunctiva/sclera: Conjunctivae normal.  Cardiovascular:     Rate and Rhythm: Normal rate and regular rhythm.     Heart sounds: No murmur.  Pulmonary:     Effort: Pulmonary effort is normal. No respiratory distress.     Breath sounds: Normal breath sounds.  Abdominal:     Palpations: Abdomen is soft.     Tenderness: There is abdominal tenderness.     Comments: Lower abdominal tenderness  Musculoskeletal:     Cervical back: Neck supple.  Skin:    General: Skin is warm and dry.  Neurological:     Mental Status: She is alert and oriented to person, place, and time.  Psychiatric:        Mood and Affect: Mood normal.        Behavior: Behavior normal.      ED Results / Procedures / Treatments   Labs (all labs ordered are listed, but only abnormal results are displayed) Labs Reviewed  COMPREHENSIVE METABOLIC PANEL - Abnormal; Notable for the following components:      Result Value   CO2 21 (*)    Calcium 8.8 (*)    AST 14 (*)    All other components within normal limits  CBC - Abnormal; Notable for the following components:   RBC 5.22 (*)    Hemoglobin 7.6 (*)    HCT 28.5 (*)    MCV 54.6 (*)    MCH 14.6 (*)    MCHC 26.7 (*)    RDW 24.0 (*)    All other components within normal limits  URINALYSIS, ROUTINE W REFLEX MICROSCOPIC - Abnormal; Notable for the following components:   Specific Gravity, Urine 1.031 (*)    Hgb urine dipstick MODERATE (*)    Protein, ur 30 (*)    Leukocytes,Ua TRACE (*)    All other components within normal limits  LIPASE, BLOOD  I-STAT BETA HCG BLOOD, ED ([facility], [facility], [facility] ONLY)    EKG None  Radiology No results found.  Procedures Procedures (including critical care time)  Medications Ordered in ED Medications  sodium chloride flush (NS) 0.9 % injection 3 mL (has no administration in time range)    ED Course  I have reviewed the triage vital signs and the nursing notes.  Pertinent labs & imaging results that were available during my care of the patient were reviewed by me and considered in my medical decision making (see chart for details).    MDM Rules/Calculators/A&P                      Patient presents to the emergency department with a chief complaint of lower abdominal pain.  She reports history of uterine fibroids.  States that she had been bleeding heavily for the past couple weeks, but she was seen and was put on hormone supplement.  She states the bleeding has since stopped.  She still complains of pelvic pain.  She denies any fevers, chills, nausea, vomiting.  She has tried taking ibuprofen with little relief.  Ultrasound of the pelvis shows a 5 cm uterine fibroid.  I suspect this  is the cause of the patient's pain.  I  have advised her to follow-up with her OB/GYN.  Patient understands and agrees with plan.   Final Clinical Impression(s) / ED Diagnoses Final diagnoses:  Fibroids    Rx / DC Orders ED Discharge Orders    None       Montine Circle, PA-C 12/30/19 [place], April, MD 12/30/19 [id number]

## 2019-12-30 NOTE — ED Notes (Signed)
Patient verbalizes understanding of discharge instructions. Opportunity for questioning and answers were provided. Armband removed by staff, pt discharged from ED. Pt. ambulatory and discharged home.  

## 2020-01-06 ENCOUNTER — Encounter (HOSPITAL_COMMUNITY): Payer: Self-pay | Admitting: Emergency Medicine

## 2020-01-06 ENCOUNTER — Emergency Department (HOSPITAL_COMMUNITY)
Admission: EM | Admit: 2020-01-06 | Discharge: 2020-01-06 | Disposition: A | Discharge status: Home / Self Care | Payer: Self-pay | Attending: Emergency Medicine | Admitting: Emergency Medicine

## 2020-01-06 ENCOUNTER — Other Ambulatory Visit: Payer: Self-pay

## 2020-01-06 DIAGNOSIS — R109 Unspecified abdominal pain: Secondary | ICD-10-CM | POA: Insufficient documentation

## 2020-01-06 DIAGNOSIS — Z5321 Procedure and treatment not carried out due to patient leaving prior to being seen by health care provider: Secondary | ICD-10-CM | POA: Insufficient documentation

## 2020-01-06 HISTORY — DX: Benign neoplasm of connective and other soft tissue, unspecified: D21.9

## 2020-01-06 LAB — URINALYSIS, ROUTINE W REFLEX MICROSCOPIC

## 2020-01-06 LAB — COMPREHENSIVE METABOLIC PANEL
ALT: 12 U/L (ref 0–44)
AST: 13 U/L — ABNORMAL LOW (ref 15–41)
Albumin: 3.5 g/dL (ref 3.5–5.0)
Alkaline Phosphatase: 47 U/L (ref 38–126)
Anion gap: 4 — ABNORMAL LOW (ref 5–15)
BUN: 10 mg/dL (ref 6–20)
CO2: 23 mmol/L (ref 22–32)
Calcium: 8.3 mg/dL — ABNORMAL LOW (ref 8.9–10.3)
Chloride: 113 mmol/L — ABNORMAL HIGH (ref 98–111)
Creatinine, Ser: 0.81 mg/dL (ref 0.44–1.00)
GFR calc Af Amer: >60 mL/min (ref >60)
GFR calc non Af Amer: >60 mL/min (ref >60)
Glucose, Bld: 93 mg/dL (ref 70–99)
Potassium: 3.5 mmol/L (ref 3.5–5.1)
Sodium: 140 mmol/L (ref 135–145)
Total Bilirubin: 0.2 mg/dL — ABNORMAL LOW (ref 0.3–1.2)
Total Protein: 6.4 g/dL — ABNORMAL LOW (ref 6.5–8.1)

## 2020-01-06 LAB — CBC
HCT: 27.8 % — ABNORMAL LOW (ref 36.0–46.0)
Hemoglobin: 7.5 g/dL — ABNORMAL LOW (ref 12.0–15.0)
MCH: 14.9 pg — ABNORMAL LOW (ref 26.0–34.0)
MCHC: 27 g/dL — ABNORMAL LOW (ref 30.0–36.0)
MCV: 55 fL — ABNORMAL LOW (ref 80.0–100.0)
Platelets: 230 10*3/uL (ref 150–400)
RBC: 5.05 MIL/uL (ref 3.87–5.11)
RDW: 24.9 % — ABNORMAL HIGH (ref 11.5–15.5)
WBC: 9.3 10*3/uL (ref 4.0–10.5)
nRBC: 0 % (ref 0.0–0.2)

## 2020-01-06 LAB — LIPASE, BLOOD: Lipase: 35 U/L (ref 11–51)

## 2020-01-06 LAB — URINALYSIS, MICROSCOPIC (REFLEX)
RBC / HPF: >50 RBC/hpf (ref 0–5)
WBC, UA: >50 WBC/hpf (ref 0–5)

## 2020-01-06 LAB — I-STAT BETA HCG BLOOD, ED (MC, WL, AP ONLY): I-stat hCG, quantitative: <5 m[IU]/mL (ref <5)

## 2020-01-06 MED ORDER — SODIUM CHLORIDE 0.9% FLUSH: 3.0000 mL | Freq: Once | INTRAVENOUS | Status: DC

## 2020-01-06 NOTE — ED Triage Notes (Signed)
Patient reports persistent pain across her abdomen this week with no emesis or diarrhea , patient stated history of fibroids/ovarian cyst , no fever or chills .

## 2020-01-06 NOTE — ED Notes (Signed)
Pt said she can no long wait  encouraged pt to stay

## 2020-01-07 ENCOUNTER — Emergency Department (HOSPITAL_COMMUNITY)
Admission: EM | Admit: 2020-01-07 | Discharge: 2020-01-07 | Disposition: A | Discharge status: Home / Self Care | Payer: Self-pay | Attending: Emergency Medicine | Admitting: Emergency Medicine

## 2020-01-07 DIAGNOSIS — N938 Other specified abnormal uterine and vaginal bleeding: Secondary | ICD-10-CM | POA: Insufficient documentation

## 2020-01-07 DIAGNOSIS — D259 Leiomyoma of uterus, unspecified: Secondary | ICD-10-CM | POA: Insufficient documentation

## 2020-01-07 DIAGNOSIS — Z79899 Other long term (current) drug therapy: Secondary | ICD-10-CM | POA: Insufficient documentation

## 2020-01-07 DIAGNOSIS — D219 Benign neoplasm of connective and other soft tissue, unspecified: Secondary | ICD-10-CM

## 2020-01-07 LAB — I-STAT BETA HCG BLOOD, ED (MC, WL, AP ONLY): I-stat hCG, quantitative: <5 m[IU]/mL

## 2020-01-07 LAB — COMPREHENSIVE METABOLIC PANEL WITH GFR
ALT: 11 U/L (ref 0–44)
AST: 13 U/L — ABNORMAL LOW (ref 15–41)
Albumin: 3.6 g/dL (ref 3.5–5.0)
Alkaline Phosphatase: 48 U/L (ref 38–126)
Anion gap: 9 (ref 5–15)
BUN: 11 mg/dL (ref 6–20)
CO2: 20 mmol/L — ABNORMAL LOW (ref 22–32)
Calcium: 8.2 mg/dL — ABNORMAL LOW (ref 8.9–10.3)
Chloride: 110 mmol/L (ref 98–111)
Creatinine, Ser: 0.82 mg/dL (ref 0.44–1.00)
GFR calc Af Amer: >60 mL/min
GFR calc non Af Amer: >60 mL/min
Glucose, Bld: 123 mg/dL — ABNORMAL HIGH (ref 70–99)
Potassium: 3.5 mmol/L (ref 3.5–5.1)
Sodium: 139 mmol/L (ref 135–145)
Total Bilirubin: 0.6 mg/dL (ref 0.3–1.2)
Total Protein: 6.6 g/dL (ref 6.5–8.1)

## 2020-01-07 LAB — CBC
HCT: 28.3 % — ABNORMAL LOW (ref 36.0–46.0)
Hemoglobin: 7.8 g/dL — ABNORMAL LOW (ref 12.0–15.0)
MCH: 15 pg — ABNORMAL LOW (ref 26.0–34.0)
MCHC: 27.6 g/dL — ABNORMAL LOW (ref 30.0–36.0)
MCV: 54.3 fL — ABNORMAL LOW (ref 80.0–100.0)
Platelets: ADEQUATE K/uL (ref 150–400)
RBC: 5.21 MIL/uL — ABNORMAL HIGH (ref 3.87–5.11)
RDW: 24.6 % — ABNORMAL HIGH (ref 11.5–15.5)
WBC: 14.5 K/uL — ABNORMAL HIGH (ref 4.0–10.5)
nRBC: 0 % (ref 0.0–0.2)

## 2020-01-07 LAB — TYPE AND SCREEN
ABO/RH(D): A POS
Antibody Screen: NEGATIVE

## 2020-01-07 LAB — ABO/RH: ABO/RH(D): A POS

## 2020-01-07 LAB — LIPASE, BLOOD: Lipase: 26 U/L (ref 11–51)

## 2020-01-07 MED ORDER — ACETAMINOPHEN 500 MG PO TABS: 500.0000 mg | ORAL_TABLET | ORAL | 0 refills | Status: AC | PRN

## 2020-01-07 MED ORDER — HYDROCODONE-ACETAMINOPHEN 5-325 MG PO TABS: 1.0000 | ORAL_TABLET | Freq: Three times a day (TID) | ORAL | 0 refills | Status: AC | PRN

## 2020-01-07 MED ORDER — SODIUM CHLORIDE 0.9% FLUSH: 3.0000 mL | Freq: Once | INTRAVENOUS | Status: DC

## 2020-01-07 MED ORDER — MEGESTROL ACETATE 40 MG PO TABS
40.0000 mg | ORAL_TABLET | Freq: Every day | ORAL | Status: DC
  Administered 2020-01-07: 40 mg via ORAL
  Filled 2020-01-07: qty 1

## 2020-01-07 MED ORDER — HYDROCODONE-ACETAMINOPHEN 5-325 MG PO TABS
1.0000 | ORAL_TABLET | Freq: Once | ORAL | Status: AC
  Administered 2020-01-07: 1 via ORAL
  Filled 2020-01-07: qty 1

## 2020-01-07 MED ORDER — DIPHENHYDRAMINE HCL 25 MG PO CAPS
50.0000 mg | ORAL_CAPSULE | Freq: Once | ORAL | Status: AC
  Administered 2020-01-07: 50 mg via ORAL
  Filled 2020-01-07: qty 2

## 2020-01-07 MED ORDER — MEGESTROL ACETATE 40 MG PO TABS
40.0000 mg | ORAL_TABLET | Freq: Every day | ORAL | 0 refills | Status: AC
Start: 2020-01-08 — End: ?

## 2020-01-07 NOTE — ED Triage Notes (Signed)
Pt presents again for eval of abdominal cramping since yesterday and n/v/d since last night. Pt has hx of fibroids and checked in for same last night but sts she left because the pain was too bad. Vaginal bleeding since 6/1.

## 2020-01-07 NOTE — ED Notes (Signed)
Pt d/c home per MD order. Discharge summary reviewed with pt, pt verbalizes understanding. Off unit via WC. NAD. Reports discharge ride home.

## 2020-01-07 NOTE — ED Provider Notes (Signed)
[facility] EMERGENCY DEPARTMENT Provider Note   CSN: [id number] Arrival date & time: 01/07/20  0751     History Chief Complaint  Patient presents with  . Abdominal Pain  . Vaginal Bleeding    Robin Larson is a 39 y.o. female.  HPI    39 year old female with history of fibroids and ovarian cyst comes in with chief complaint of pelvic pain and vaginal bleeding.  Patient was noted to have dysfunctional uterine bleeding in May.  Subsequent ultrasound revealed fibroids.  Patient was started on Megace and was doing well from bleeding perspective until recently, when she ran out of her Megace..  Patient has started having heavy bleeding again with associated cramping lower quadrant pain.  The pain is similar to the pain she was having during her ED visits.  Patient denies any high risk sexual behavior and had negative GC and Chlamydia testing last month.  Review of system is negative for any UTI-like symptoms, fevers, chills, vaginal discharge.  Her pain was intermittent, however now it has become more constant.  She has an appointment with gynecology on 25th, but given the severity of pain decided to come to the ER sooner.  Past Medical History:  Diagnosis Date  . Anemia   . Fibroids   . Headache   . Hypertension    no meds since approx 2 yrs ago 2013  . Ovarian cyst     Patient Active Problem List   Diagnosis Date Noted  . Left ovarian cyst 08/01/2014  . 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 CESAREAN SECTION  2001, 2004   x 2  . LAPAROTOMY Left 08/01/2014   Procedure: LAPAROTOMY/REMOVAL OF LEFT OVARIAN CYSTECTOMY;  Surgeon: Lavonia Drafts, MD;  Location: [facility];  Service: Gynecology;  Laterality: Left;  . OOPHORECTOMY Left 08/01/2014   Procedure: LEFT OOPHORECTOMY;  Surgeon: Lavonia Drafts, MD;  Location: [facility];  Service: Gynecology;  Laterality: Left;  . OVARIAN CYST REMOVAL    . TUBAL LIGATION        OB History    Gravida  2   Para  2   Term  1   Preterm  1   AB      Living  3     SAB      TAB      Ectopic      Multiple  1   Live Births              No family history on file.  Social History   Tobacco Use  . Smoking status: Never Smoker  . Smokeless tobacco: Never Used  Substance Use Topics  . Alcohol use: No  . Drug use: No    Home Medications Prior to Admission medications   Medication Sig Start Date End Date Taking? Authorizing Provider  acetaminophen (TYLENOL) 500 MG tablet Take 1,000-1,500 mg by mouth every 4 (four) hours as needed (pain).    [provider]  aspirin-acetaminophen-caffeine (EXCEDRIN MIGRAINE) [phone] MG per tablet Take 2 tablets by mouth every 6 (six) hours as needed for headache or migraine.     [provider]  megestrol (MEGACE) 40 MG tablet Take 1 tablet (40 mg total) by mouth daily. 12/15/19   Recardo Evangelist, PA-C    Allergies    Percocet [oxycodone-acetaminophen] and Codeine  Review of Systems   Review of Systems  Constitutional: Positive for activity change.  Respiratory: Negative for shortness  of breath.   Cardiovascular: Negative for chest pain.  Gastrointestinal: Positive for abdominal pain, nausea and vomiting.  Genitourinary: Positive for pelvic pain.  Allergic/Immunologic: Negative for immunocompromised state.  All other systems reviewed and are negative.   Physical Exam Updated Vital Signs BP 115/71   Pulse 90   Temp 99.1 F (37.3 C) (Oral)   Resp 17   SpO2 100%   Physical Exam Vitals and nursing note reviewed.  Constitutional:      Appearance: She is well-developed.  HENT:     Head: Normocephalic and atraumatic.  Eyes:     Pupils: Pupils are equal, round, and reactive to light.  Cardiovascular:     Rate and Rhythm: Normal rate and regular rhythm.     Heart sounds: Normal heart sounds.  Pulmonary:     Effort: Pulmonary effort is normal. No respiratory distress.    Abdominal:     General: There is no distension.     Palpations: Abdomen is soft.     Tenderness: There is abdominal tenderness in the right lower quadrant, suprapubic area and left lower quadrant. There is no guarding or rebound.  Musculoskeletal:     Cervical back: Neck supple.  Skin:    General: Skin is warm and dry.  Neurological:     Mental Status: She is alert and oriented to person, place, and time.     ED Results / Procedures / Treatments   Labs (all labs ordered are listed, but only abnormal results are displayed) Labs Reviewed  COMPREHENSIVE METABOLIC PANEL - Abnormal; Notable for the following components:      Result Value   CO2 20 (*)    Glucose, Bld 123 (*)    Calcium 8.2 (*)    AST 13 (*)    All other components within normal limits  CBC - Abnormal; Notable for the following components:   WBC 14.5 (*)    RBC 5.21 (*)    Hemoglobin 7.8 (*)    HCT 28.3 (*)    MCV 54.3 (*)    MCH 15.0 (*)    MCHC 27.6 (*)    RDW 24.6 (*)    All other components within normal limits  LIPASE, BLOOD  URINALYSIS, ROUTINE W REFLEX MICROSCOPIC  I-STAT BETA HCG BLOOD, ED ([facility], [facility], [facility] ONLY)  TYPE AND SCREEN    EKG None  Radiology No results found.  Procedures Procedures (including critical care time)  Medications Ordered in ED Medications  sodium chloride flush (NS) 0.9 % injection 3 mL (has no administration in time range)  megestrol (MEGACE) tablet 40 mg (has no administration in time range)  HYDROcodone-acetaminophen (NORCO/VICODIN) 5-325 MG per tablet 1 tablet (1 tablet Oral Given 01/07/20 0913)  diphenhydrAMINE (BENADRYL) capsule 50 mg (50 mg Oral Given 01/07/20 0913)    ED Course  I have reviewed the triage vital signs and the nursing notes.  Pertinent labs & imaging results that were available during my care of the patient were reviewed by me and considered in my medical decision making (see chart for details).    MDM Rules/Calculators/A&P                           Patient comes in a chief complaint of pelvic pain.  She has had intermittent pelvic pain that has become more constant.  She also has started having vaginal bleeding again.  Patient was seen in the ER in May when she had A pelvic  exam that did not show any signs of infection.  She was started on Megace and her bleeding actually stopped.  Later on she came in with abdominal pain, ultrasound was done at that visit and she was noted to have fibroids.  Patient has an appointment on 25th.  Her lab work today shows anemia at baseline level of 7.6.  She was 8.2 last month when she had vaginal bleeding.  She is having active bleed however therefore we decided to discuss case with Dr. Kennon Rounds, gynecology.  They will try to get patient in sooner than 25th, but for now they do not think we need to transfuse her unless her hemoglobin is less than 7 or she is having acute organ dysfunction/signs of malperfusion.  We will start patient on Megace.  Her abdominal exam reveals lower quadrant pelvic pain, which likely is secondary to her fibroids.  Patient is not having any UTI-like symptoms and the pain is not consistent with ovarian torsion, bladder infection, PERFED viscus.  We will try to control her symptoms better.  Patient will likely be discharged with strict ER return precautions.  Final Clinical Impression(s) / ED Diagnoses Final diagnoses:  DUB (dysfunctional uterine bleeding)  Fibroids    Rx / DC Orders ED Discharge Orders    None       Varney Biles, MD 01/07/20 [id number]

## 2020-01-07 NOTE — Discharge Instructions (Signed)
Is the ER for abdominal pain and vaginal bleeding.  You have known fibroids, we suspect that your symptoms are because of it.  For the vaginal bleeding, start taking Megace. For pain control take the medication as prescribed.  Return to the ER if you start having severe bleeding, fainting or near fainting spells, chest pain or shortness of breath.

## 2020-01-07 NOTE — ED Notes (Signed)
Walked patient to the bathroom patient did well 

## 2020-01-20 ENCOUNTER — Ambulatory Visit: Payer: Self-pay | Admitting: Obstetrics and Gynecology

## 2020-02-16 ENCOUNTER — Encounter: Payer: Self-pay | Admitting: Obstetrics and Gynecology

## 2020-02-16 ENCOUNTER — Ambulatory Visit: Payer: Self-pay | Admitting: Obstetrics and Gynecology

## 2021-06-21 IMAGING — US US PELVIS COMPLETE
1 series · 13 of 25 positions shown · non-contrast
Comparison: Prior CT from 05/13/2017.

CLINICAL DATA: Initial evaluation for acute left adnexal pain for 1
day.

EXAM:
TRANSABDOMINAL ULTRASOUND OF PELVIS
DOPPLER ULTRASOUND OF OVARIES
TECHNIQUE: Transabdominal ultrasound examination of the pelvis was performed
including evaluation of the uterus, ovaries, adnexal regions, and
pelvic cul-de-sac.
Color and duplex Doppler ultrasound was utilized to evaluate blood
flow to the ovaries.

[Series 1: us pelvis (transabdominal only) · 13 of 51 slices shown]
[im 1/51]
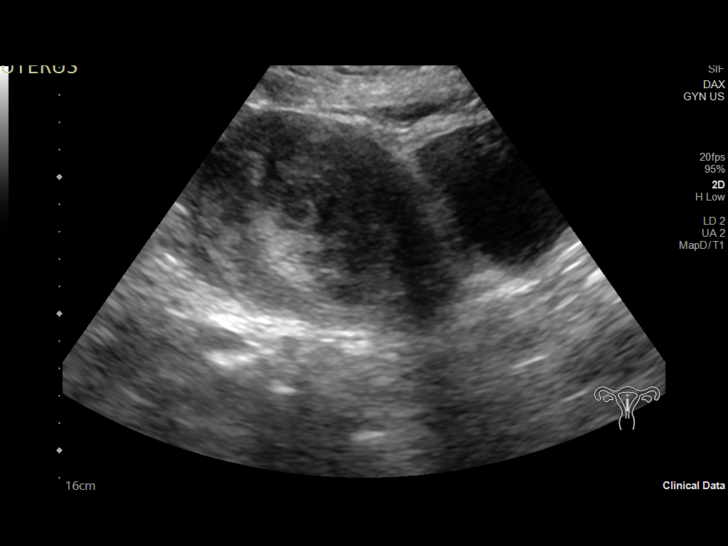
[im 5/51]
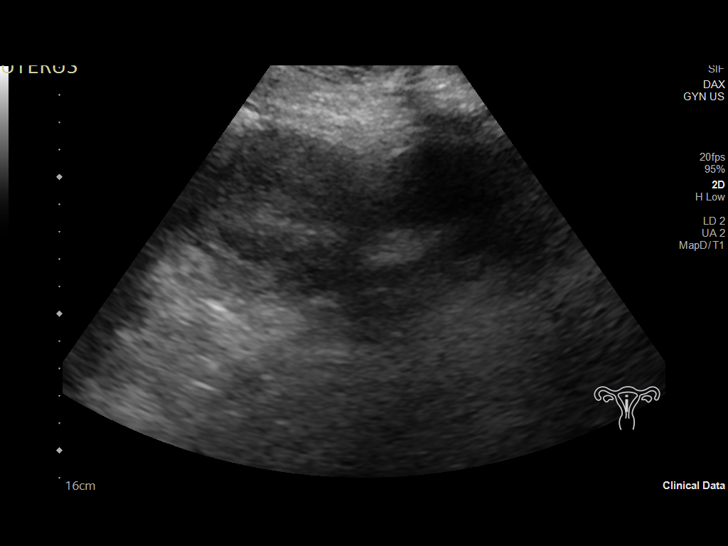
[im 9/51]
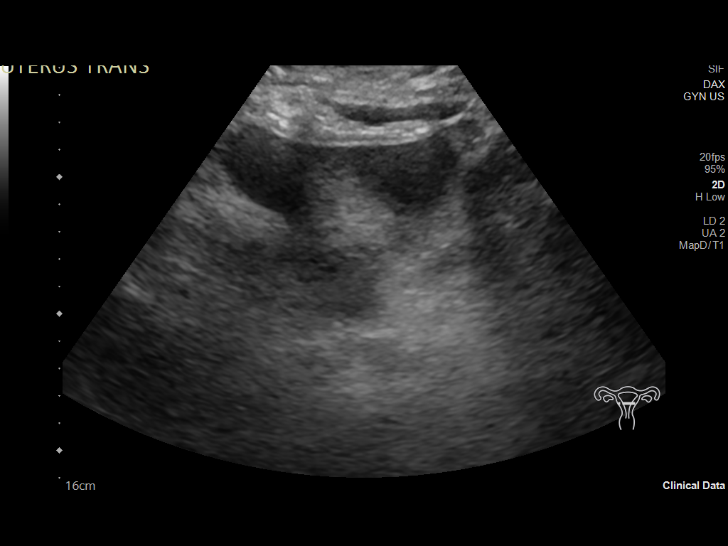
[im 13/51]
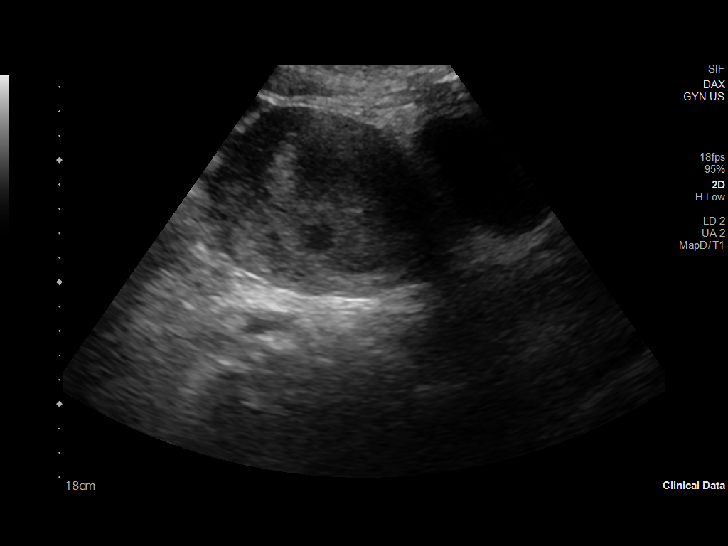
[im 17/51]
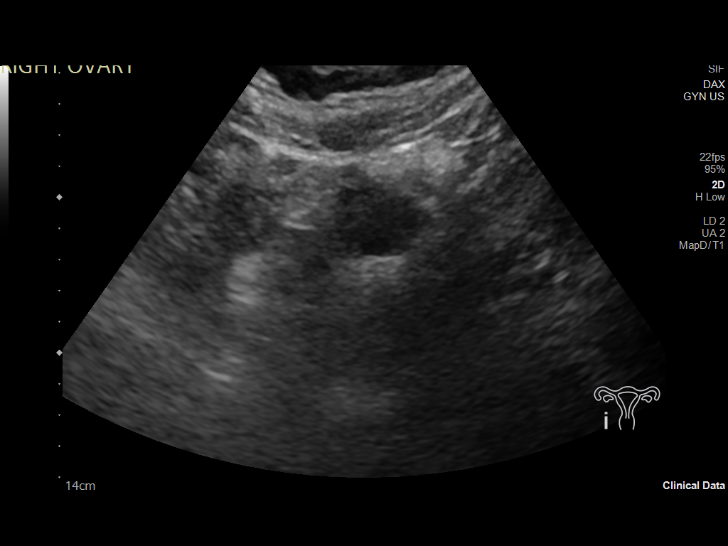
[im 21/51]
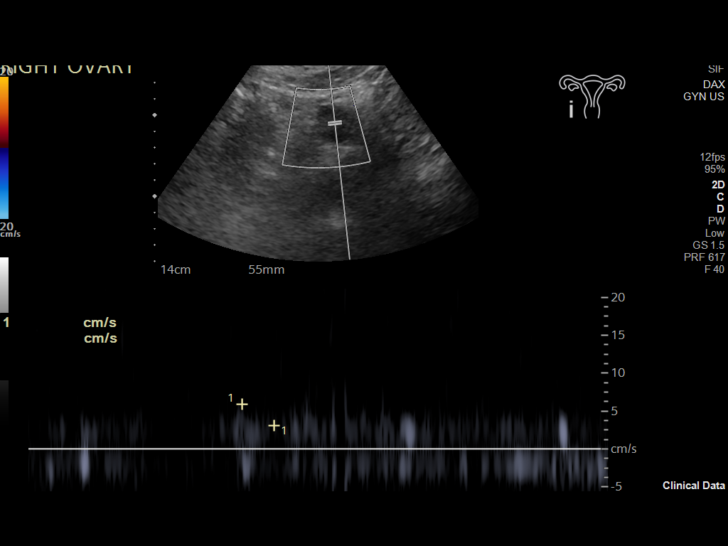
[im 26/51]
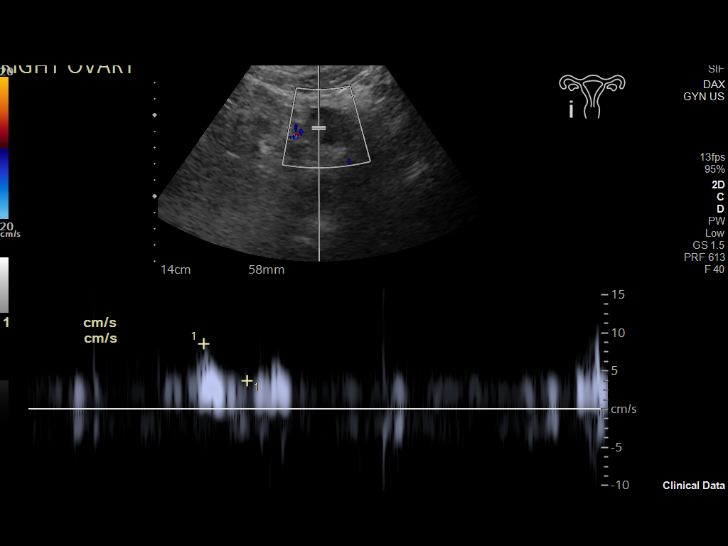
[im 30/51]
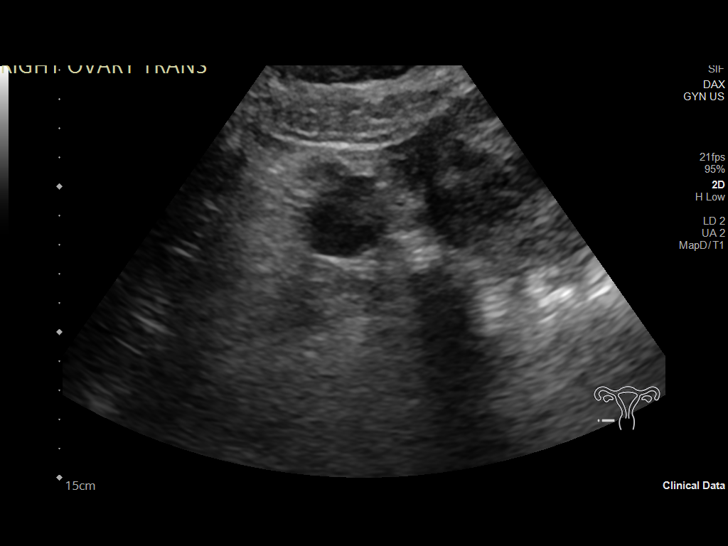
[im 34/51]
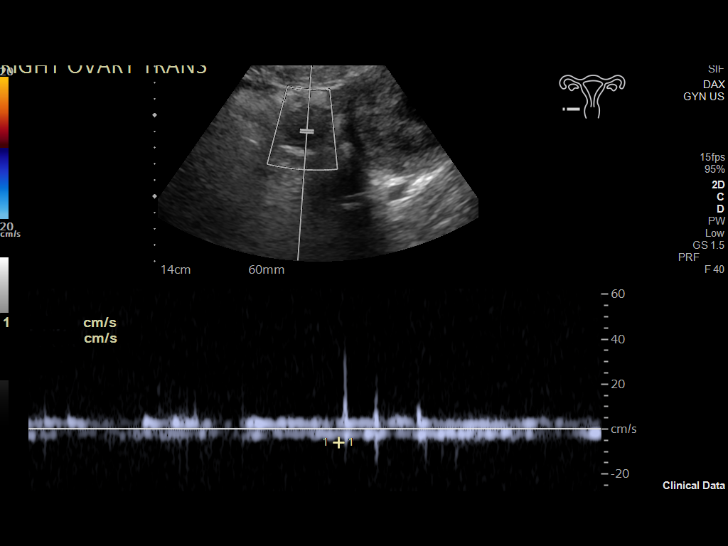
[im 38/51]
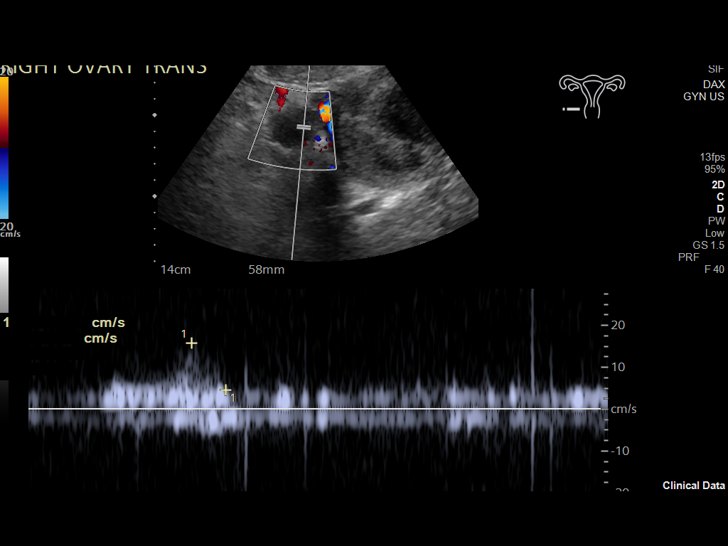
[im 42/51]
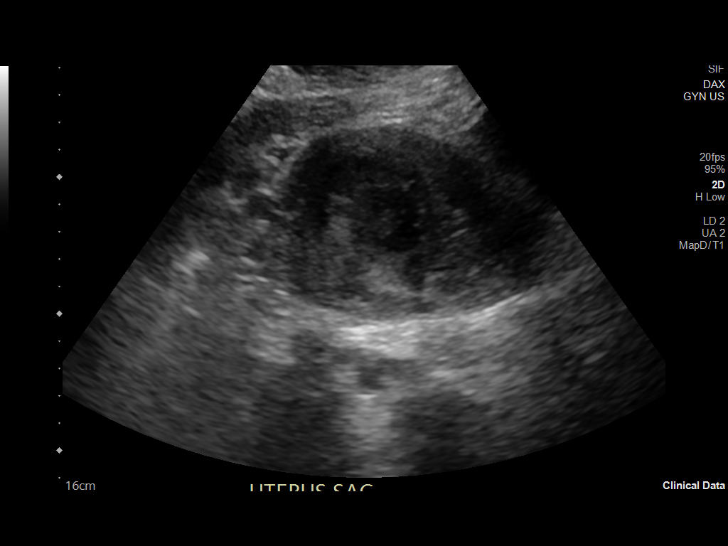
[im 46/51]
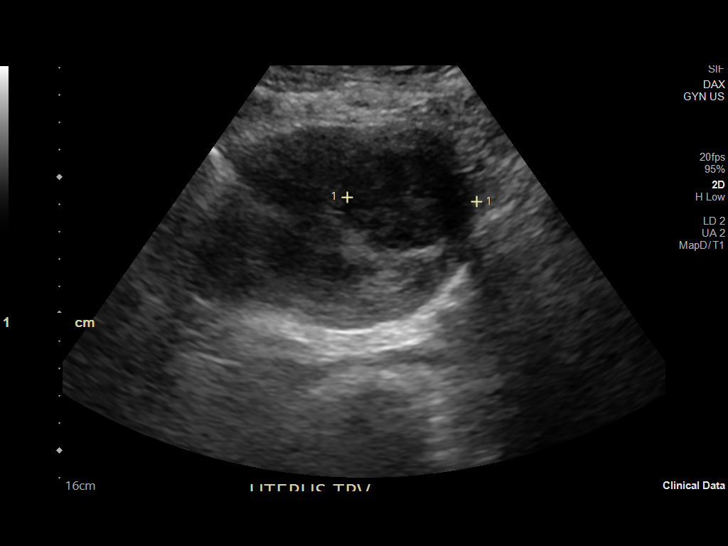
[im 51/51]
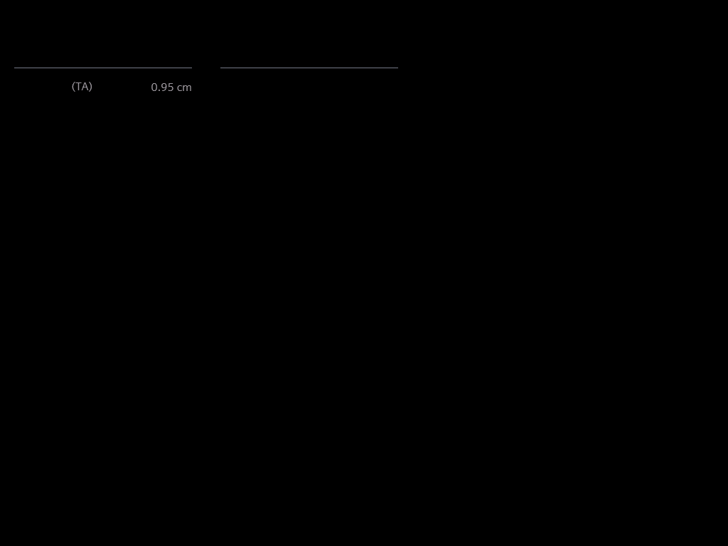

[13 of 25 positions shown; findings below may reference images not displayed]

FINDINGS: Uterus

Measurements: 12.6 x 7.3 x 7.9 cm = volume: 381 mL. 5.0 x 3.9 x
cm intramural fibroid present at the left uterine fundus.

Endometrium

Thickness: 10 mm.  No focal abnormality visualized.

Right ovary

Measurements: 3.2 x 2.5 x 2.7 cm = volume: 11.3 mL. Normal
appearance/no adnexal mass.

Left ovary

Not visualized, reportedly surgically absent.  No adnexal mass.

Pulsed Doppler evaluation demonstrates normal low-resistance
arterial and venous waveforms in the right ovary.

Other: No free fluid seen within the pelvis.
IMPRESSION: 1. 5 cm intramural fibroid at the left uterine fundus. Finding could
potentially contribute to underlying left adnexal pain.
2. Normal right ovary without evidence for torsion.
3. Nonvisualization of the left ovary, reportedly surgically absent.
No left adnexal mass or free fluid within the pelvis.

## 2021-06-21 IMAGING — US US ART/VEN ABD/PELV/SCROTUM DOPPLER LTD
1 series · 13 of 25 positions shown · non-contrast
Comparison: Prior CT from 05/13/2017.

CLINICAL DATA: Initial evaluation for acute left adnexal pain for 1
day.

EXAM:
TRANSABDOMINAL ULTRASOUND OF PELVIS
DOPPLER ULTRASOUND OF OVARIES
TECHNIQUE: Transabdominal ultrasound examination of the pelvis was performed
including evaluation of the uterus, ovaries, adnexal regions, and
pelvic cul-de-sac.
Color and duplex Doppler ultrasound was utilized to evaluate blood
flow to the ovaries.

[Series 1: us pelvis (transabdominal only) · 13 of 51 slices shown]
[im 1/51]
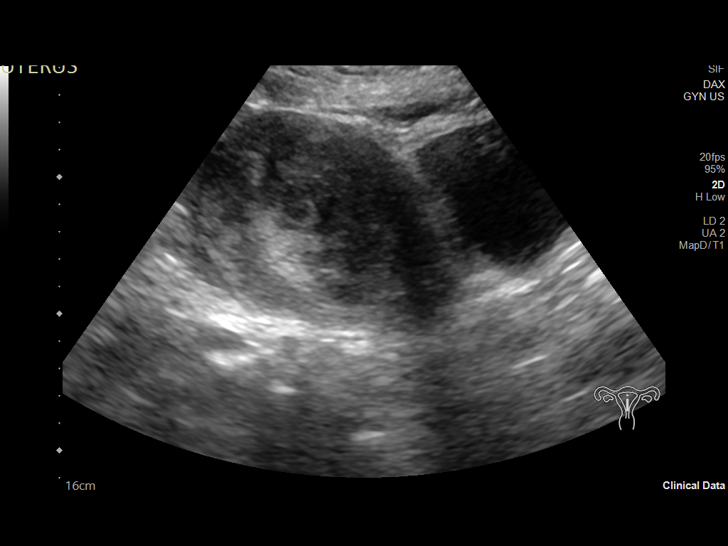
[im 5/51]
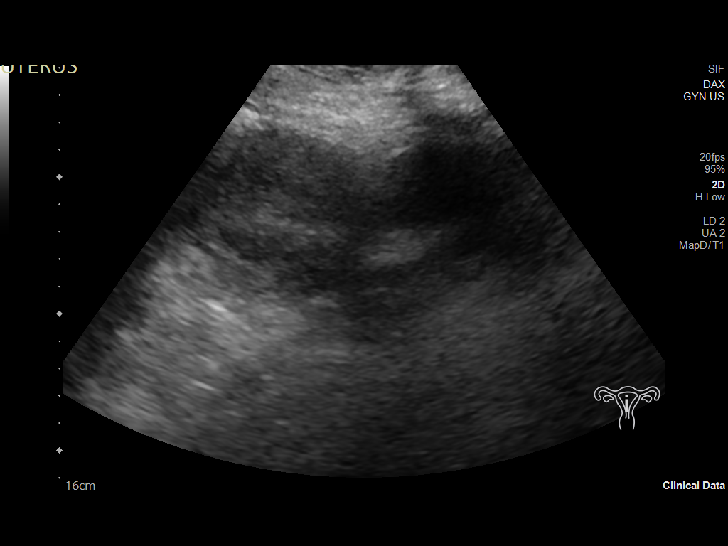
[im 9/51]
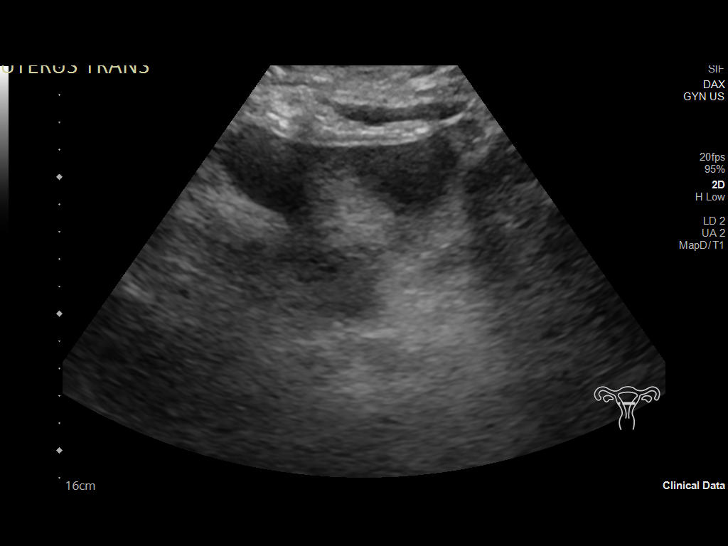
[im 13/51]
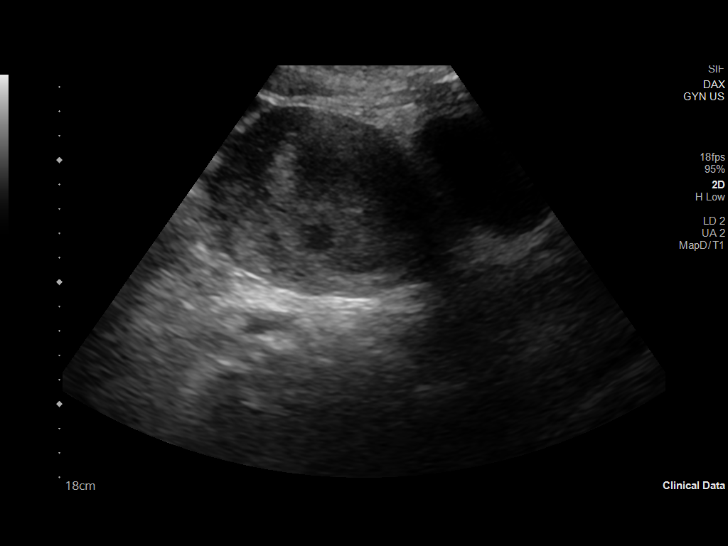
[im 17/51]
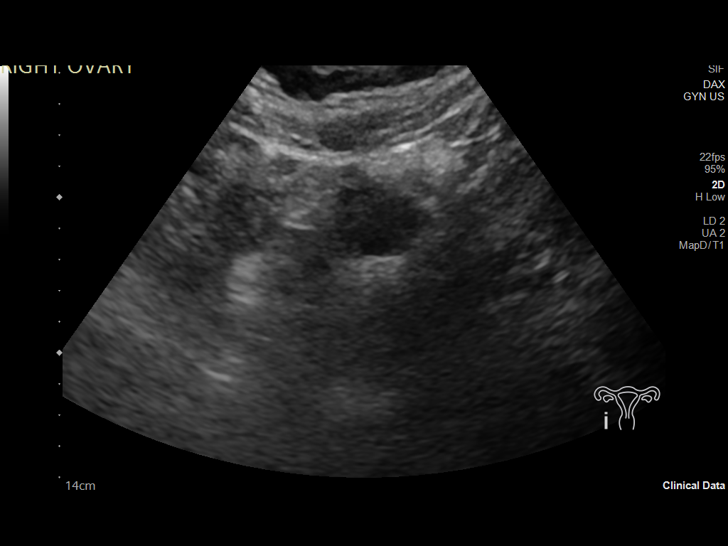
[im 21/51]
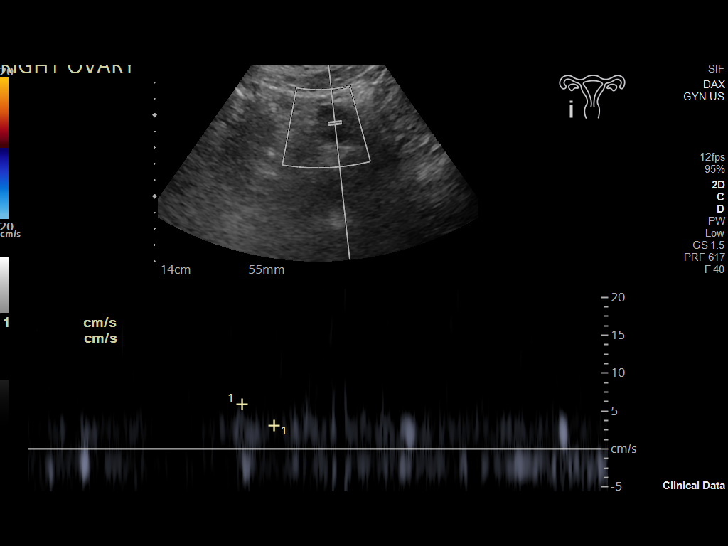
[im 26/51]
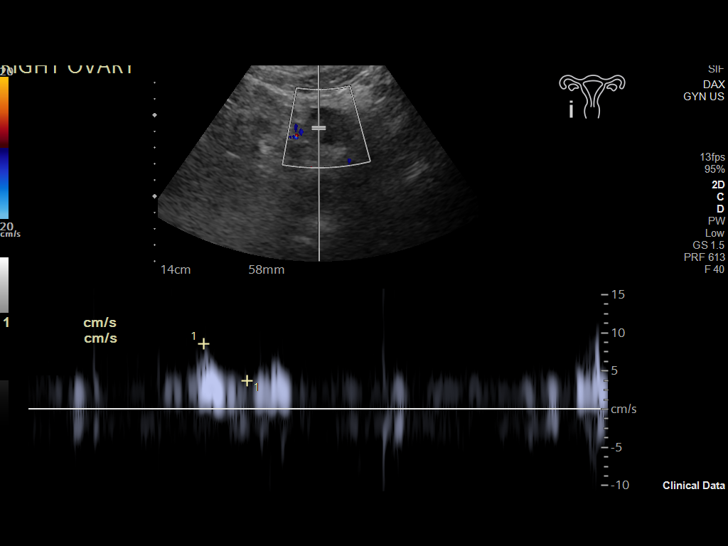
[im 30/51]
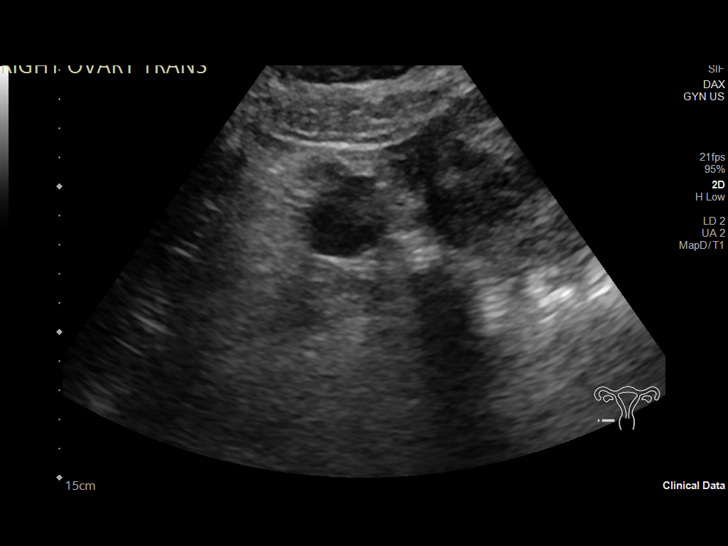
[im 34/51]
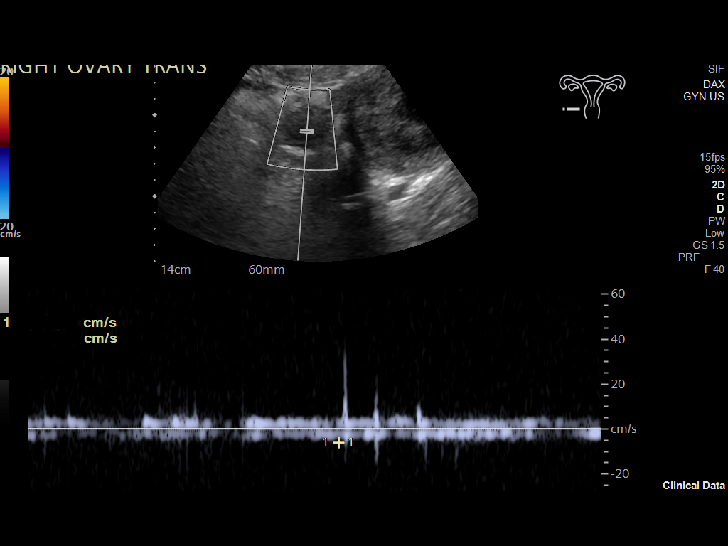
[im 38/51]
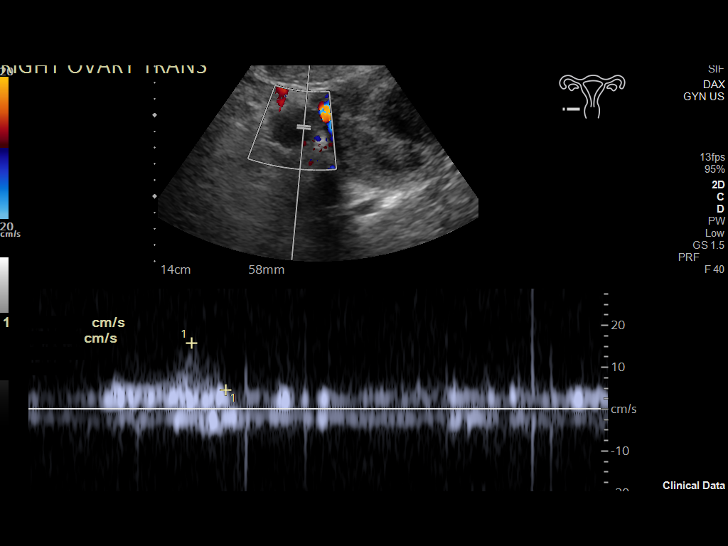
[im 42/51]
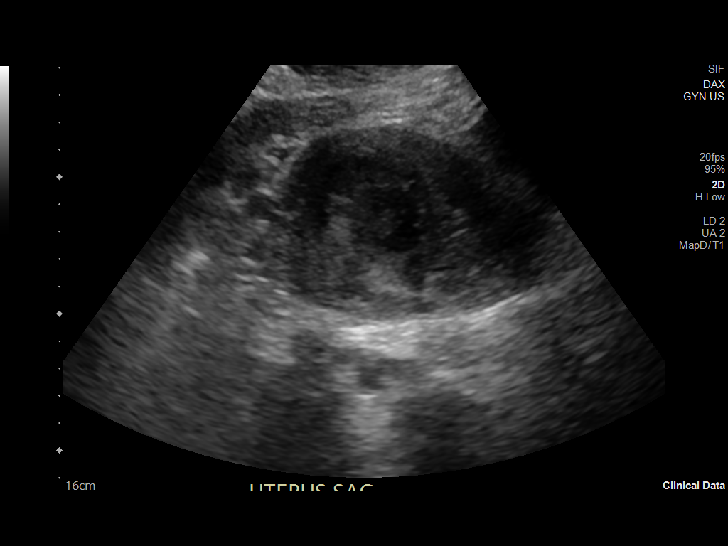
[im 46/51]
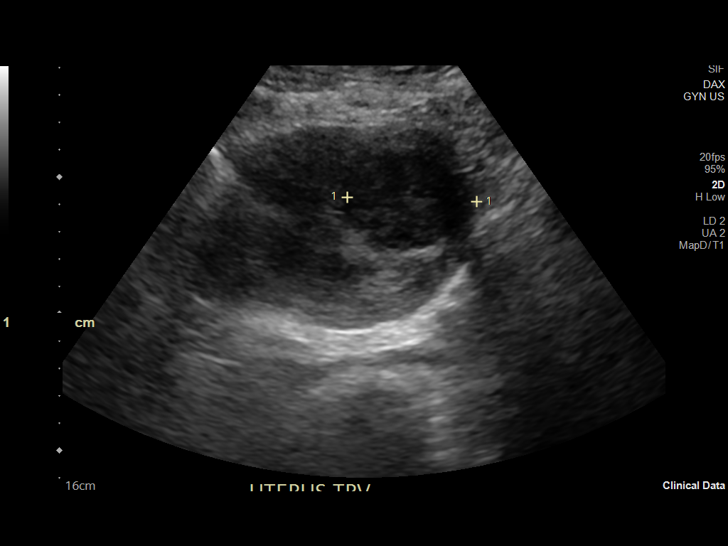
[im 51/51]
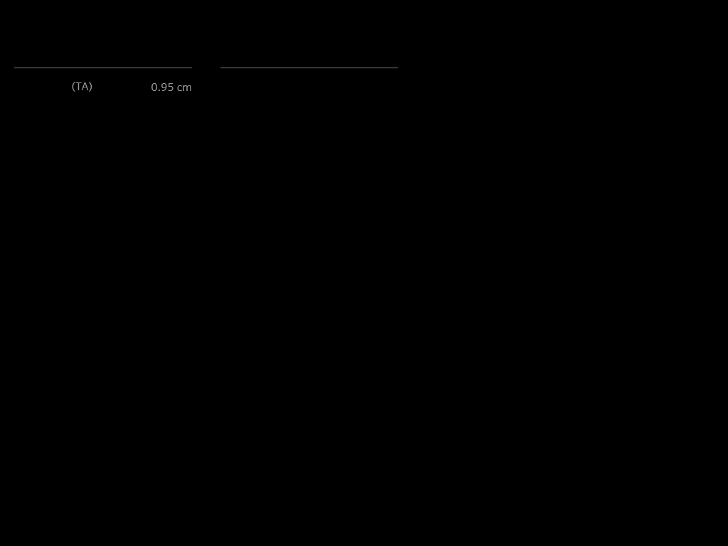

[13 of 25 positions shown; findings below may reference images not displayed]

FINDINGS: Uterus

Measurements: 12.6 x 7.3 x 7.9 cm = volume: 381 mL. 5.0 x 3.9 x
cm intramural fibroid present at the left uterine fundus.

Endometrium

Thickness: 10 mm.  No focal abnormality visualized.

Right ovary

Measurements: 3.2 x 2.5 x 2.7 cm = volume: 11.3 mL. Normal
appearance/no adnexal mass.

Left ovary

Not visualized, reportedly surgically absent.  No adnexal mass.

Pulsed Doppler evaluation demonstrates normal low-resistance
arterial and venous waveforms in the right ovary.

Other: No free fluid seen within the pelvis.
IMPRESSION: 1. 5 cm intramural fibroid at the left uterine fundus. Finding could
potentially contribute to underlying left adnexal pain.
2. Normal right ovary without evidence for torsion.
3. Nonvisualization of the left ovary, reportedly surgically absent.
No left adnexal mass or free fluid within the pelvis.
